# Patient Record
Sex: Male | Born: 1970 | Hispanic: No | Marital: Married | Smoking: Current some day smoker
Health system: Southern US, Community
[De-identification: ages and names within clinical notes are randomized; demographics above are authoritative.]

## PROBLEM LIST (undated history)

## (undated) DIAGNOSIS — L309 Dermatitis, unspecified: Secondary | ICD-10-CM

## (undated) DIAGNOSIS — I10 Essential (primary) hypertension: Secondary | ICD-10-CM

## (undated) HISTORY — DX: Dermatitis, unspecified: L30.9

---

## 2015-02-07 ENCOUNTER — Emergency Department (HOSPITAL_COMMUNITY)
Admission: EM | Admit: 2015-02-07 | Discharge: 2015-02-07 | Disposition: A | Discharge status: Home / Self Care | Payer: Self-pay | Attending: Emergency Medicine | Admitting: Emergency Medicine

## 2015-02-07 ENCOUNTER — Encounter (HOSPITAL_COMMUNITY): Payer: Self-pay | Admitting: Emergency Medicine

## 2015-02-07 DIAGNOSIS — H8110 Benign paroxysmal vertigo, unspecified ear: Secondary | ICD-10-CM | POA: Insufficient documentation

## 2015-02-07 DIAGNOSIS — Z720 Tobacco use: Secondary | ICD-10-CM | POA: Insufficient documentation

## 2015-02-07 DIAGNOSIS — R42 Dizziness and giddiness: Secondary | ICD-10-CM

## 2015-02-07 DIAGNOSIS — N289 Disorder of kidney and ureter, unspecified: Secondary | ICD-10-CM | POA: Insufficient documentation

## 2015-02-07 HISTORY — DX: Essential (primary) hypertension: I10

## 2015-02-07 LAB — COMPREHENSIVE METABOLIC PANEL
ALT: 28 U/L (ref 17–63)
ANION GAP: 9 (ref 5–15)
AST: 28 U/L (ref 15–41)
Albumin: 3.6 g/dL (ref 3.5–5.0)
Alkaline Phosphatase: 76 U/L (ref 38–126)
BILIRUBIN TOTAL: 0.2 mg/dL — AB (ref 0.3–1.2)
BUN: 9 mg/dL (ref 6–20)
CALCIUM: 9.2 mg/dL (ref 8.9–10.3)
CHLORIDE: 107 mmol/L (ref 101–111)
CO2: 24 mmol/L (ref 22–32)
CREATININE: 1.37 mg/dL — AB (ref 0.61–1.24)
GLUCOSE: 171 mg/dL — AB (ref 65–99)
POTASSIUM: 3.4 mmol/L — AB (ref 3.5–5.1)
Sodium: 140 mmol/L (ref 135–145)
Total Protein: 6 g/dL — ABNORMAL LOW (ref 6.5–8.1)

## 2015-02-07 LAB — CBC WITH DIFFERENTIAL/PLATELET
BASOS ABS: 0 10*3/uL (ref 0.0–0.1)
Basophils Relative: 0 %
EOS PCT: 2 %
Eosinophils Absolute: 0.2 10*3/uL (ref 0.0–0.7)
HCT: 47.4 % (ref 39.0–52.0)
Hemoglobin: 16.7 g/dL (ref 13.0–17.0)
LYMPHS PCT: 26 %
Lymphs Abs: 2.3 10*3/uL (ref 0.7–4.0)
MCH: 32.3 pg (ref 26.0–34.0)
MCHC: 35.2 g/dL (ref 30.0–36.0)
MCV: 91.7 fL (ref 78.0–100.0)
MONO ABS: 0.9 10*3/uL (ref 0.1–1.0)
MONOS PCT: 10 %
Neutro Abs: 5.6 10*3/uL (ref 1.7–7.7)
Neutrophils Relative %: 62 %
Platelets: 284 10*3/uL (ref 150–400)
RBC: 5.17 MIL/uL (ref 4.22–5.81)
RDW: 13.8 % (ref 11.5–15.5)
WBC: 9.1 10*3/uL (ref 4.0–10.5)

## 2015-02-07 MED ORDER — MECLIZINE HCL 25 MG PO TABS: 25.0000 mg | ORAL_TABLET | Freq: Three times a day (TID) | ORAL | Status: DC | PRN

## 2015-02-07 NOTE — ED Provider Notes (Signed)
CSN: [id number]     Arrival date & time 02/07/15  4098 History   First MD Initiated Contact with Patient 02/07/15 [phone]     Chief complaint: Dizziness  (Consider location/radiation/quality/duration/timing/severity/associated sxs/prior Treatment) The history is provided by the patient. A language interpreter was used.   44 year old male is a [place] refugee and has been here for 3 weeks. This evening, he had sudden onset of dizziness which was like a spinning sensation. He was unable to stand but did not have any nausea or vomiting. He states that his face felt hot. He denies headache or tinnitus or hearing loss. He had a similar episode about 3 months ago which lasted about 10 or 15 minutes. This episode also resolved within about 10 minutes and he feels like he is back to normal. He is a cigarette smoker admitting the smoking 2 packs of cigarettes a day but denies ethanol use and denies significant medical history.  Past Medical History  Diagnosis Date  . Hypertension    History reviewed. No pertinent past surgical history. No family history on file. Social History  Substance Use Topics  . Smoking status: Current Some Day Smoker  . Smokeless tobacco: None  . Alcohol Use: No    Review of Systems  All other systems reviewed and are negative.     Allergies  Review of patient's allergies indicates no known allergies.  Home Medications   Prior to Admission medications   Not on File   BP 132/79 mmHg  Temp(Src) 98.5 F (36.9 C) (Oral)  Resp 16  SpO2 95% Physical Exam  Nursing note and vitals reviewed.  44 year old male, resting comfortably and in no acute distress. Vital signs are normal. Oxygen saturation is 95%, which is normal. Head is normocephalic and atraumatic. PERRLA, EOMI. Oropharynx is clear. Neck is nontender and supple without adenopathy or JVD. There are no carotid bruits. Back is nontender and there is no CVA tenderness. Lungs are clear without rales, wheezes, or  rhonchi. Chest is nontender. Heart has regular rate and rhythm without murmur. Abdomen is soft, flat, nontender without masses or hepatosplenomegaly and peristalsis is normoactive. Extremities have no cyanosis or edema, full range of motion is present. Skin is warm and dry without rash. Neurologic: Mental status is normal, cranial nerves are intact, there are no motor or sensory deficits. Dizziness is not reproduced by passive head movement. Romberg is normal.  ED Course  Procedures (including critical care time) Labs Review Results for orders placed or performed during the hospital encounter of 02/07/15  CBC with Differential  Result Value Ref Range   WBC 9.1 4.0 - 10.5 K/uL   RBC 5.17 4.22 - 5.81 MIL/uL   Hemoglobin 16.7 13.0 - 17.0 g/dL   HCT 47.8 29.5 - 62.1 %   MCV 91.7 78.0 - 100.0 fL   MCH 32.3 26.0 - 34.0 pg   MCHC 35.2 30.0 - 36.0 g/dL   RDW 30.8 65.7 - 84.6 %   Platelets 284 150 - 400 K/uL   Neutrophils Relative % 62 %   Neutro Abs 5.6 1.7 - 7.7 K/uL   Lymphocytes Relative 26 %   Lymphs Abs 2.3 0.7 - 4.0 K/uL   Monocytes Relative 10 %   Monocytes Absolute 0.9 0.1 - 1.0 K/uL   Eosinophils Relative 2 %   Eosinophils Absolute 0.2 0.0 - 0.7 K/uL   Basophils Relative 0 %   Basophils Absolute 0.0 0.0 - 0.1 K/uL  Comprehensive metabolic panel  Result Value  Ref Range   Sodium 140 135 - 145 mmol/L   Potassium 3.4 (L) 3.5 - 5.1 mmol/L   Chloride 107 101 - 111 mmol/L   CO2 24 22 - 32 mmol/L   Glucose, Bld 171 (H) 65 - 99 mg/dL   BUN 9 6 - 20 mg/dL   Creatinine, Ser 8.11 (H) 0.61 - 1.24 mg/dL   Calcium 9.2 8.9 - 91.4 mg/dL   Total Protein 6.0 (L) 6.5 - 8.1 g/dL   Albumin 3.6 3.5 - 5.0 g/dL   AST 28 15 - 41 U/L   ALT 28 17 - 63 U/L   Alkaline Phosphatase 76 38 - 126 U/L   Total Bilirubin 0.2 (L) 0.3 - 1.2 mg/dL   GFR calc non Af Amer >60 >60 mL/min   GFR calc Af Amer >60 >60 mL/min   Anion gap 9 5 - 15   I have personally reviewed and evaluated these lab results as  part of my medical decision-making.  ECG shows normal sinus rhythm with a rate of 92, no ectopy. Normal axis. Normal P wave. Normal QRS. Normal intervals. Normal ST and T waves. Probable left atrial hypertrophy. Impression: Left atrial hypertrophy, otherwise normal ECG. No prior ECG available for comparison.   MDM   Final diagnoses:  Paroxysmal vertigo  Renal insufficiency    Peripheral vertigo which has spontaneously resolved. He will be given a prescription for meclizine to use on an as-needed basis.    Dione Booze, MD 02/07/15 [phone]

## 2015-02-07 NOTE — ED Notes (Signed)
Dr. Preston Fleeting explained plan of care and discharge plan to pt.

## 2015-02-07 NOTE — Discharge Instructions (Signed)
Vertigo °Vertigo means you feel like you or your surroundings are moving when they are not. Vertigo can be dangerous if it occurs when you are at work, driving, or performing difficult activities.  °CAUSES  °Vertigo occurs when there is a conflict of signals sent to your brain from the visual and sensory systems in your body. There are many different causes of vertigo, including: °· Infections, especially in the inner ear. °· A bad reaction to a drug or misuse of alcohol and medicines. °· Withdrawal from drugs or alcohol. °· Rapidly changing positions, such as lying down or rolling over in bed. °· A migraine headache. °· Decreased blood flow to the brain. °· Increased pressure in the brain from a head injury, infection, tumor, or bleeding. °SYMPTOMS  °You may feel as though the world is spinning around or you are falling to the ground. Because your balance is upset, vertigo can cause nausea and vomiting. You may have involuntary eye movements (nystagmus). °DIAGNOSIS  °Vertigo is usually diagnosed by physical exam. If the cause of your vertigo is unknown, your caregiver may perform imaging tests, such as an MRI scan (magnetic resonance imaging). °TREATMENT  °Most cases of vertigo resolve on their own, without treatment. Depending on the cause, your caregiver may prescribe certain medicines. If your vertigo is related to body position issues, your caregiver may recommend movements or procedures to correct the problem. In rare cases, if your vertigo is caused by certain inner ear problems, you may need surgery. °HOME CARE INSTRUCTIONS  °· Follow your caregiver's instructions. °· Avoid driving. °· Avoid operating heavy machinery. °· Avoid performing any tasks that would be dangerous to you or others during a vertigo episode. °· Tell your caregiver if you notice that certain medicines seem to be causing your vertigo. Some of the medicines used to treat vertigo episodes can actually make them worse in some people. °SEEK  IMMEDIATE MEDICAL CARE IF:  °· Your medicines do not relieve your vertigo or are making it worse. °· You develop problems with talking, walking, weakness, or using your arms, hands, or legs. °· You develop severe headaches. °· Your nausea or vomiting continues or gets worse. °· You develop visual changes. °· A family member notices behavioral changes. °· Your condition gets worse. °MAKE SURE YOU: °· Understand these instructions. °· Will watch your condition. °· Will get help right away if you are not doing well or get worse. °Document Released: 02/09/2005 Document Revised: 07/25/2011 Document Reviewed: 11/18/2010 °ExitCare® Patient Information ©2015 [employer]. This information is not intended to replace advice given to you by your health care provider. Make sure you discuss any questions you have with your health care provider. ° °Meclizine tablets or capsules °What is this medicine? °MECLIZINE (MEK li zeen) is an antihistamine. It is used to prevent nausea, vomiting, or dizziness caused by motion sickness. It is also used to prevent and treat vertigo (extreme dizziness or a feeling that you or your surroundings are tilting or spinning around). °This medicine may be used for other purposes; ask your health care provider or pharmacist if you have questions. °COMMON BRAND NAME(S): Antivert, Dramamine Less Drowsy, Medivert, Meni-D °What should I tell my health care provider before I take this medicine? °They need to know if you have any of these conditions: °-asthma °-glaucoma °-prostate trouble °-stomach problems °-urinary problems °-an unusual or allergic reaction to meclizine, other medicines, foods, dyes, or preservatives °-pregnant or trying to get pregnant °-breast-feeding °How should I use this medicine? °  Take this medicine by mouth with a glass of water. Follow the directions on the prescription label. If you are using this medicine to prevent motion sickness, take the dose at least 1 hour before travel.  If it upsets your stomach, take it with food or milk. Take your doses at regular intervals. Do not take your medicine more often than directed. Talk to your pediatrician regarding the use of this medicine in children. Special care may be needed. Overdosage: If you think you have taken too much of this medicine contact a poison control center or emergency room at once. NOTE: This medicine is only for you. Do not share this medicine with others. What if I miss a dose? If you miss a dose, take it as soon as you can. If it is almost time for your next dose, take only that dose. Do not take double or extra doses. What may interact with this medicine? -barbiturate medicines for inducing sleep or treating seizures -digoxin -medicines for anxiety or sleeping problems, like alprazolam, diazepam or temazepam -medicines for hay fever and other allergies -medicines for mental depression -medicines for movement abnormalities as in Parkinson's disease, or for stomach problems -medicines for pain -medicines that relax muscles This list may not describe all possible interactions. Give your health care provider a list of all the medicines, herbs, non-prescription drugs, or dietary supplements you use. Also tell them if you smoke, drink alcohol, or use illegal drugs. Some items may interact with your medicine. What should I watch for while using this medicine? If you are taking this medicine on a regular schedule, visit your doctor or health care professional for regular checks on your progress. You may get dizzy, drowsy or have blurred vision. Do not drive, use machinery, or do anything that needs mental alertness until you know how this medicine affects you. Do not stand or sit up quickly, especially if you are an older patient. This reduces the risk of dizzy or fainting spells. Alcohol can increase possible dizziness. Avoid alcoholic drinks. Your mouth may get dry. Chewing sugarless gum or sucking hard candy,  and drinking plenty of water may help. Contact your doctor if the problem does not go away or is severe. This medicine may cause dry eyes and blurred vision. If you wear contact lenses you may feel some discomfort. Lubricating drops may help. See your eye doctor if the problem does not go away or is severe. What side effects may I notice from receiving this medicine? Side effects that you should report to your doctor or health care professional as soon as possible: -fainting spells -fast or irregular heartbeat Side effects that usually do not require medical attention (report to your doctor or health care professional if they continue or are bothersome): -constipation -difficulty passing urine -difficulty sleeping -headache -stomach upset This list may not describe all possible side effects. Call your doctor for medical advice about side effects. You may report side effects to FDA at 1-800-FDA-1088. Where should I keep my medicine? Keep out of the reach of children. Store at room temperature between 15 and 30 degrees C (59 and 86 degrees F). Keep container tightly closed. Throw away any unused medicine after the expiration date. NOTE: This sheet is a summary. It may not cover all possible information. If you have questions about this medicine, talk to your doctor, pharmacist, or health care provider.  2015, Elsevier/Gold Standard. (2007-11-08 10:35:36)   Emergency Department Resource Guide 1) Find a Doctor and Pay Out of  Pocket Although you won't have to find out who is covered by your insurance plan, it is a good idea to ask around and get recommendations. You will then need to call the office and see if the doctor you have chosen will accept you as a new patient and what types of options they offer for patients who are self-pay. Some doctors offer discounts or will set up payment plans for their patients who do not have insurance, but you will need to ask so you aren't surprised when you get  to your appointment.  2) Contact Your Local Health Department Not all health departments have doctors that can see patients for sick visits, but many do, so it is worth a call to see if yours does. If you don't know where your local health department is, you can check in your phone book. The CDC also has a tool to help you locate your state's health department, and many state websites also have listings of all of their local health departments.  3) Find a Walk-in Clinic If your illness is not likely to be very severe or complicated, you may want to try a walk in clinic. These are popping up all over the country in pharmacies, drugstores, and shopping centers. They're usually staffed by nurse practitioners or physician assistants that have been trained to treat common illnesses and complaints. They're usually fairly quick and inexpensive. However, if you have serious medical issues or chronic medical problems, these are probably not your best option.  No Primary Care Doctor: - Call Health Connect at  [phone] - they can help you locate a primary care doctor that  accepts your insurance, provides certain services, etc. - Physician Referral Service- [phone]  Chronic Pain Problems: Organization         Address  Phone   Notes  [facility]  [phone] Patients need to be referred by their primary care doctor.   Medication Assistance: Organization         Address  Phone   Notes  [facility] Medication [facility] [address] [place] [place], [place] [address] [phone] --Must be a resident of [facility] -- Must have NO insurance coverage whatsoever (no Medicaid/ Medicare, etc.) -- The pt. MUST have a primary care doctor that directs their care regularly and follows them in the community   [facility]  [phone]   [employer]  [phone]    Agencies that provide inexpensive medical care: Organization         Address  Phone    Notes  [facility]  [phone]   [facility]    [phone]   [facility] [address] [place], [place] [address] [phone]   [facility] [address] [place] [phone]   Planned Parenthood    [phone]   [facility]    [phone]   [facility]  [address], [place] Phone:  [phone], Fax:  [phone] Hours of Operation:  9 am - 6 pm, M-F.  Also accepts Medicaid/Medicare and self-pay.  [facility]  [address], [place] Phone: [phone], Fax: [phone]. Hours of Operation:  8:30 am - 5:30 pm, M-F.  Also accepts Medicaid and self-pay.  [facility] [place] [address], [employer] Phone: [phone]   [facility] [address] [place], [place] [phone], Ext. [phone] Mondays & Thursdays: 7-9 AM.  First 15 patients are seen on a first come, first serve basis.    Medicaid-accepting [facility] Providers:  Organization         Address  Phone   Notes  [facility] [address], [place] [phone] Also accepts self-pay patients.  [facility] [address] Laurell Josephs [place], [place]  [phone]   [facility] [address], [place] [phone]   [facility] [address], [place] [phone]   Renaye Rakers [address], [place]   [phone] Only accepts [place] Access [place] patients after they have their name applied to their card.   Self-Pay (no insurance) in [facility]:  Organization         Address  Phone   Notes  Sickle Cell Patients, [facility] [address] [place], [place] [phone]   [facility] [address] [place], [place] [phone]   [facility]  [address], [place] [phone]   [facility]/Dr. Osei-Bonsu  [address], [place] or [address], [place] [phone] Phone number for both [place] and [place] locations is the same.  [facility] and [facility] [address] [place] [phone]   [facility] [address] [place] or [address] [phone]   [facility] [address] [place] [phone], phone; [phone], fax Sees patients 1st and 3rd Saturday of every month.  Must not qualify for public or private insurance (i.e. Medicaid, Medicare, [place] Health Choice, Veterans' Benefits)  Household income should be no more than 200% of the poverty level The clinic cannot treat you if you are pregnant or think you are pregnant  Sexually transmitted diseases are not treated at the clinic.    Dental Care: Organization         Address  Phone  Notes  [facility] Department of [facility] [address] [place], [place] [phone] Accepts children up to age 76 who are enrolled in [place] or [place] Health Choice; pregnant women with a Medicaid card; and children who have applied for Medicaid or [place] Health Choice, but were declined, whose parents can pay a reduced fee at time of service.  [facility] Department of [facility]  [address], [place], [place] [phone] Accepts children up to age 101 who are enrolled in [place] or [place] Health Choice; pregnant women with a Medicaid card; and children who have applied for Medicaid or [place] Health Choice, but were declined, whose parents can pay a reduced fee at time of service.  [facility]  [address] [place], [place] [phone] Patients are seen by appointment only. Walk-ins are not accepted. [facility] will see patients 5 years of age  and older. Monday - Tuesday (8am-5pm) Most Wednesdays (8:30-5pm) $30 per visit, cash only  [facility]  [address], [facility] [phone] Patients are seen by appointment only. Walk-ins are not accepted. [facility] will see patients 39 years of age and older. One Wednesday Evening (Monthly: Volunteer Based).  $30 per visit, cash only  [employer] of Dentistry Clinics  [phone] for adults; Children under age 66, call Graduate Pediatric Dentistry at [phone]. Children aged 71-14, please call [phone] to request a pediatric application.  Dental services are provided in all areas of dental care including fillings, crowns and bridges, complete and partial dentures, implants, gum treatment, root canals, and extractions. Preventive care is also provided. Treatment is provided to both adults and children. Patients are selected via a lottery and there is often a waiting list.   [facility] [address] [place]  [phone] [email or web address]   [facility] [address] [place], [place] [phone], Ext. [phone] Second and Fourth Thursday of each month, opens at 6:30 AM; Clinic ends at 9 AM.  Patients are seen on a first-come first-served basis, and a limited number are seen during each clinic.   [facility]  [address] Ether Griffins [place], [place] [phone]   Eligibility Requirements You must have lived in [place], [place], or [place] counties for at least the last three months.   You cannot be eligible for state or federal sponsored [place], including [employer], [place], or [employer].   You generally cannot be eligible for healthcare insurance through your employer.    How to apply: Eligibility screenings are held every Tuesday and Wednesday afternoon from 1:00 pm until 4:00 pm. You do not need an appointment for the interview!  [facility] [address], [place], [place] [phone]   [facility]  [phone]   [facility] Health Department  [phone]   [facility] Department  [phone]    Behavioral Health Resources in the Community: Intensive Outpatient Programs Organization         Address  Phone  Notes  [facility] [address] [place], [place] [phone]   [facility] [address], [place], [place] [phone]   [facility] [address] [place], [place]  [phone]   [facility] [address],  [place], [place] [phone] or [phone]   Substance Abuse Resources Organization         Address  Phone  Notes  [facility]  [phone]   [facility]  [phone]   [facility] [place]  [phone]   Floydene Flock  [phone]   Residential & Outpatient Substance Abuse Program  [phone]   Psychological Services Organization         Address  Phone  Notes  [facility] Behavioral Health  [phone]   [facility] Services  [phone]   [facility] Mental Health [address] [place] [phone] or [phone]    Mobile Crisis Teams Organization         Address  Phone  Notes  [facility], Mobile Crisis Care Unit  [phone]   [facility]  [address] [place], [place] [phone]   Doristine Locks [address] [place] [phone]    Self-Help/Support Groups Organization         Address  Phone             Notes  Mental Health Assoc. of  - variety of support groups  336- I7437963 Call for more information  Narcotics Anonymous (NA), [facility] [address], [employer] [place]  2 meetings at this location   Statistician  Address  Phone  Notes  [facility] [address]    [place]  [phone]   [facility]  [address] [place] [address], [place], [place] [phone]   [facility] [address] [place], [place] [phone] Admissions: 8am-3pm M-F  [facility] [address]    [place], [place] [phone]   [facility] [address] [place], [place], [place] [phone]   [facility] [address]  [place], [place] [phone]   [facility] - Intensive Outpatient [address] Laurell Josephs [address], [place], [place] [phone]   [facility] ([facility] [address] [place]  [place], [place] [phone] or [phone]   [facility] ([facility]) [address], [place], [place] [phone] Accepts Medicaid  [facility] [address]  [place] [phone] Substance Abuse/Addiction Treatment   [facility] Organization         Address  Phone  Notes  [facility]  [phone]   Angie Fava, PhD [address] Ervin Knack [place], [place]   [phone] or [phone]   [facility]   [address] [place], [place] [phone]   [facility] [address] [place], [place] [phone] Insurance/Medicaid/sponsorship through [facility] and Families [address]                                    [place], [place] [phone] Therapy/tele-psych/case  [facility] [address], [place] [phone]    Dr. Lolly Mustache  [phone]   [facility]  [employer] [facility] Dept. 1) [address] [place] 2) [address], [place] 3)  [address], [place] [phone]  [phone]   [facility] Child Abuse Hotline [phone]) 561-9619 or [phone] (After Hours)

## 2015-02-07 NOTE — ED Notes (Addendum)
Pt. arrived with EMS from home reports dizziness onset this evening , denies nausea or vomitting . No fever or chills. Pt. speaks Arabic , a refugee from [place] arrived here 01/16/2015 . Interpreter service utilized during encounter.

## 2015-02-07 NOTE — ED Notes (Signed)
EKG reviewed by Dr. Preston Fleeting.

## 2015-04-07 ENCOUNTER — Emergency Department (INDEPENDENT_AMBULATORY_CARE_PROVIDER_SITE_OTHER)
Admission: EM | Admit: 2015-04-07 | Discharge: 2015-04-07 | Disposition: A | Discharge status: Home / Self Care | Payer: Medicaid Other | Source: Home / Self Care

## 2015-04-07 ENCOUNTER — Encounter (HOSPITAL_COMMUNITY): Payer: Self-pay | Admitting: Emergency Medicine

## 2015-04-07 DIAGNOSIS — J40 Bronchitis, not specified as acute or chronic: Secondary | ICD-10-CM

## 2015-04-07 LAB — POCT RAPID STREP A: Streptococcus, Group A Screen (Direct): NEGATIVE

## 2015-04-07 MED ORDER — AZITHROMYCIN 250 MG PO TABS: 250.0000 mg | ORAL_TABLET | Freq: Every day | ORAL | Status: DC

## 2015-04-07 MED ORDER — IBUPROFEN 600 MG PO TABS
600.0000 mg | ORAL_TABLET | Freq: Four times a day (QID) | ORAL | Status: DC | PRN
Start: 2015-04-07 — End: 2015-06-07

## 2015-04-07 MED ORDER — ALBUTEROL SULFATE HFA 108 (90 BASE) MCG/ACT IN AERS: 2.0000 | INHALATION_SPRAY | RESPIRATORY_TRACT | Status: DC | PRN

## 2015-04-07 NOTE — Discharge Instructions (Signed)
Upper Respiratory Infection, Adult Most upper respiratory infections (URIs) are a viral infection of the air passages leading to the lungs. A URI affects the nose, throat, and upper air passages. The most common type of URI is nasopharyngitis and is typically referred to as "the common cold." URIs run their course and usually go away on their own. Most of the time, a URI does not require medical attention, but sometimes a bacterial infection in the upper airways can follow a viral infection. This is called a secondary infection. Sinus and middle ear infections are common types of secondary upper respiratory infections. Bacterial pneumonia can also complicate a URI. A URI can worsen asthma and chronic obstructive pulmonary disease (COPD). Sometimes, these complications can require emergency medical care and may be life threatening.  CAUSES Almost all URIs are caused by viruses. A virus is a type of germ and can spread from one person to another.  RISKS FACTORS You may be at risk for a URI if:   You smoke.   You have chronic heart or lung disease.  You have a weakened defense (immune) system.   You are very young or very old.   You have nasal allergies or asthma.  You work in crowded or poorly ventilated areas.  You work in health care facilities or schools. SIGNS AND SYMPTOMS  Symptoms typically develop 2-3 days after you come in contact with a cold virus. Most viral URIs last 7-10 days. However, viral URIs from the influenza virus (flu virus) can last 14-18 days and are typically more severe. Symptoms may include:   Runny or stuffy (congested) nose.   Sneezing.   Cough.   Sore throat.   Headache.   Fatigue.   Fever.   Loss of appetite.   Pain in your forehead, behind your eyes, and over your cheekbones (sinus pain).  Muscle aches.  DIAGNOSIS  Your health care provider may diagnose a URI by:  Physical exam.  Tests to check that your symptoms are not due to  another condition such as:  Strep throat.  Sinusitis.  Pneumonia.  Asthma. TREATMENT  A URI goes away on its own with time. It cannot be cured with medicines, but medicines may be prescribed or recommended to relieve symptoms. Medicines may help:  Reduce your fever.  Reduce your cough.  Relieve nasal congestion. HOME CARE INSTRUCTIONS   Take medicines only as directed by your health care provider.   Gargle warm saltwater or take cough drops to comfort your throat as directed by your health care provider.  Use a warm mist humidifier or inhale steam from a shower to increase air moisture. This may make it easier to breathe.  Drink enough fluid to keep your urine clear or pale yellow.   Eat soups and other clear broths and maintain good nutrition.   Rest as needed.   Return to work when your temperature has returned to normal or as your health care provider advises. You may need to stay home longer to avoid infecting others. You can also use a face mask and careful hand washing to prevent spread of the virus.  Increase the usage of your inhaler if you have asthma.   Do not use any tobacco products, including cigarettes, chewing tobacco, or electronic cigarettes. If you need help quitting, ask your health care provider. PREVENTION  The best way to protect yourself from getting a cold is to practice good hygiene.   Avoid oral or hand contact with people with cold   symptoms.   Wash your hands often if contact occurs.  There is no clear evidence that vitamin C, vitamin E, echinacea, or exercise reduces the chance of developing a cold. However, it is always recommended to get plenty of rest, exercise, and practice good nutrition.  SEEK MEDICAL CARE IF:   You are getting worse rather than better.   Your symptoms are not controlled by medicine.   You have chills.  You have worsening shortness of breath.  You have brown or red mucus.  You have yellow or brown nasal  discharge.  You have pain in your face, especially when you bend forward.  You have a fever.  You have swollen neck glands.  You have pain while swallowing.  You have white areas in the back of your throat. SEEK IMMEDIATE MEDICAL CARE IF:   You have severe or persistent:  Headache.  Ear pain.  Sinus pain.  Chest pain.  You have chronic lung disease and any of the following:  Wheezing.  Prolonged cough.  Coughing up blood.  A change in your usual mucus.  You have a stiff neck.  You have changes in your:  Vision.  Hearing.  Thinking.  Mood. MAKE SURE YOU:   Understand these instructions.  Will watch your condition.  Will get help right away if you are not doing well or get worse.   This information is not intended to replace advice given to you by your health care provider. Make sure you discuss any questions you have with your health care provider.   Document Released: 10/26/2000 Document Revised: 09/16/2014 Document Reviewed: 08/07/2013 Elsevier Interactive Patient Education 2016 Elsevier Inc.  

## 2015-04-07 NOTE — ED Notes (Signed)
Pt here with c/o cough, congestion with yellow, thick phlegm Fever, chills and body aches noted as well No medication taken at home, sx's started 2 dys ago [place] interpretor used for Arabic communication  Afebrile, smoker

## 2015-04-11 LAB — CULTURE, GROUP A STREP

## 2015-06-07 ENCOUNTER — Emergency Department (HOSPITAL_COMMUNITY)
Admission: EM | Admit: 2015-06-07 | Discharge: 2015-06-07 | Disposition: A | Discharge status: Home / Self Care | Payer: Medicaid Other | Attending: Emergency Medicine | Admitting: Emergency Medicine

## 2015-06-07 ENCOUNTER — Encounter (HOSPITAL_COMMUNITY): Payer: Self-pay | Admitting: Adult Health

## 2015-06-07 DIAGNOSIS — J029 Acute pharyngitis, unspecified: Secondary | ICD-10-CM | POA: Insufficient documentation

## 2015-06-07 DIAGNOSIS — I10 Essential (primary) hypertension: Secondary | ICD-10-CM | POA: Diagnosis not present

## 2015-06-07 DIAGNOSIS — F17200 Nicotine dependence, unspecified, uncomplicated: Secondary | ICD-10-CM | POA: Insufficient documentation

## 2015-06-07 DIAGNOSIS — Z792 Long term (current) use of antibiotics: Secondary | ICD-10-CM | POA: Diagnosis not present

## 2015-06-07 DIAGNOSIS — Z79899 Other long term (current) drug therapy: Secondary | ICD-10-CM | POA: Insufficient documentation

## 2015-06-07 LAB — RAPID STREP SCREEN (MED CTR MEBANE ONLY): STREPTOCOCCUS, GROUP A SCREEN (DIRECT): NEGATIVE

## 2015-06-07 MED ORDER — IBUPROFEN 600 MG PO TABS: 600.0000 mg | ORAL_TABLET | Freq: Four times a day (QID) | ORAL | Status: DC | PRN

## 2015-06-07 NOTE — ED Provider Notes (Signed)
CSN: [id number]     Arrival date & time 06/07/15  1905 History  By signing my name below, I, Murriel Hopper, attest that this documentation has been prepared under the direction and in the presence of [employer], PA-C.  Electronically Signed: Murriel Hopper, ED Scribe. 06/07/2015. 8:07 PM.   Chief Complaint  Patient presents with  . Sore Throat      Patient is a 45 y.o. male presenting with pharyngitis. The history is provided by the patient and a relative. A language interpreter was used.  Sore Throat Pertinent negatives include no chest pain and no shortness of breath.   HPI Comments: Craig Gillespie is a 45 y.o. male who presents to the Emergency Department complaining of a constant, worsening sore throat that has been present for three days. Pt also reports that he has developed a productive cough, has had intermittent chills, and has pain while swallowing. Pt denies SOB, chest pain, fever, or difficulty swallowing. He has not taken anything for his symptoms.   Past Medical History  Diagnosis Date  . Hypertension    History reviewed. No pertinent past surgical history. History reviewed. No pertinent family history. Social History  Substance Use Topics  . Smoking status: Current Some Day Smoker  . Smokeless tobacco: None  . Alcohol Use: No    Review of Systems  Constitutional: Positive for chills. Negative for fever.  HENT: Positive for sore throat. Negative for trouble swallowing.   Respiratory: Positive for cough. Negative for shortness of breath.   Cardiovascular: Negative for chest pain.  All other systems reviewed and are negative.     Allergies  Review of patient's allergies indicates no known allergies.  Home Medications   Prior to Admission medications   Medication Sig Start Date End Date Taking? Authorizing Provider  albuterol (PROVENTIL HFA;VENTOLIN HFA) 108 (90 BASE) MCG/ACT inhaler Inhale 2 puffs into the lungs every 4 (four) hours as needed for  wheezing or shortness of breath. 04/07/15   Tharon Aquas, PA  azithromycin (ZITHROMAX) 250 MG tablet Take 1 tablet (250 mg total) by mouth daily. Take first 2 tablets together, then 1 every day until finished. 04/07/15   Tharon Aquas, PA  ibuprofen (ADVIL,MOTRIN) 600 MG tablet Take 1 tablet (600 mg total) by mouth every 6 (six) hours as needed. 04/07/15   Tharon Aquas, PA  meclizine (ANTIVERT) 25 MG tablet Take 1 tablet (25 mg total) by mouth 3 (three) times daily as needed for dizziness. 02/07/15   Dione Booze, MD   BP 128/74 mmHg  Pulse 86  Temp(Src) 98.5 F (36.9 C) (Oral)  Resp 18  Wt 198 lb 6.6 oz (90 kg)  SpO2 98% Physical Exam  Constitutional: He is oriented to person, place, and time. He appears well-developed and well-nourished.  HENT:  Head: Normocephalic and atraumatic.  Mouth/Throat: Uvula is midline. No trismus in the jaw. Posterior oropharyngeal erythema present. No oropharyngeal exudate or posterior oropharyngeal edema.  Erythema of oropharynx No exudates Uvula is midline Handling secretions well Normal voice phonation Positive anterior cervical lymphadenopathy  Normal TMs bilaterally   Neck: Normal range of motion. Neck supple.  Cardiovascular: Normal rate, regular rhythm and normal heart sounds.   Pulmonary/Chest: Effort normal and breath sounds normal. No respiratory distress. He has no wheezes. He has no rales. He exhibits no tenderness.  Abdominal: He exhibits no distension.  Lymphadenopathy:    He has cervical adenopathy.  Neurological: He is alert and oriented to person, place, and time.  Skin: Skin is warm and dry.  Psychiatric: He has a normal mood and affect.  Nursing note and vitals reviewed.   ED Course  Procedures (including critical care time)  DIAGNOSTIC STUDIES: Oxygen Saturation is 98% on room air, normal by my interpretation.    COORDINATION OF CARE: 8:00 PM Discussed treatment plan with pt at bedside and pt agreed to plan.   Labs  Review Labs Reviewed  RAPID STREP SCREEN (NOT AT [facility])  CULTURE, GROUP A STREP [facility])    Imaging Review No results found. I have personally reviewed and evaluated these images and lab results as part of my medical decision-making.   EKG Interpretation None      MDM   Final diagnoses:  None  Patient presents today with a chief complaint of sore throat.  Rapid strep negative.  No signs of peritonsillar abscess or retropharyngeal abscess.  Suspect viral illness.  Patient stable for discharge.  Return precautions given. I personally performed the services described in this documentation, which was scribed in my presence. The recorded information has been reviewed and is accurate.     Santiago Glad, PA-C 06/07/15 2344  Gerhard Munch, MD 06/07/15 [phone]

## 2015-06-07 NOTE — ED Notes (Signed)
Presents with red , sore throat associated with fever of unknown temperature last night. Pt als reports cough and pain with swallowing. Throat red.

## 2015-06-10 LAB — CULTURE, GROUP A STREP (THRC)

## 2015-06-13 ENCOUNTER — Encounter (HOSPITAL_COMMUNITY): Payer: Self-pay | Admitting: Emergency Medicine

## 2015-06-13 ENCOUNTER — Emergency Department (HOSPITAL_COMMUNITY)
Admission: EM | Admit: 2015-06-13 | Discharge: 2015-06-13 | Disposition: A | Discharge status: Home / Self Care | Payer: Medicaid Other | Attending: Emergency Medicine | Admitting: Emergency Medicine

## 2015-06-13 ENCOUNTER — Emergency Department (HOSPITAL_COMMUNITY): Payer: Medicaid Other

## 2015-06-13 DIAGNOSIS — Z792 Long term (current) use of antibiotics: Secondary | ICD-10-CM | POA: Diagnosis not present

## 2015-06-13 DIAGNOSIS — Z79899 Other long term (current) drug therapy: Secondary | ICD-10-CM | POA: Diagnosis not present

## 2015-06-13 DIAGNOSIS — J40 Bronchitis, not specified as acute or chronic: Secondary | ICD-10-CM | POA: Insufficient documentation

## 2015-06-13 DIAGNOSIS — F17200 Nicotine dependence, unspecified, uncomplicated: Secondary | ICD-10-CM | POA: Diagnosis not present

## 2015-06-13 DIAGNOSIS — I10 Essential (primary) hypertension: Secondary | ICD-10-CM | POA: Diagnosis not present

## 2015-06-13 DIAGNOSIS — R05 Cough: Secondary | ICD-10-CM | POA: Diagnosis present

## 2015-06-13 LAB — RAPID STREP SCREEN (MED CTR MEBANE ONLY): Streptococcus, Group A Screen (Direct): NEGATIVE

## 2015-06-13 MED ORDER — GUAIFENESIN-DM 100-10 MG/5ML PO SYRP: 5.0000 mL | ORAL_SOLUTION | ORAL | Status: DC | PRN

## 2015-06-13 MED ORDER — PREDNISONE 20 MG PO TABS
60.0000 mg | ORAL_TABLET | Freq: Once | ORAL | Status: AC
  Administered 2015-06-13: 60 mg via ORAL
  Filled 2015-06-13: qty 3

## 2015-06-13 MED ORDER — PREDNISONE 10 MG PO TABS: 20.0000 mg | ORAL_TABLET | Freq: Every day | ORAL | Status: DC

## 2015-06-13 MED ORDER — ALBUTEROL SULFATE HFA 108 (90 BASE) MCG/ACT IN AERS
2.0000 | INHALATION_SPRAY | Freq: Once | RESPIRATORY_TRACT | Status: AC
  Administered 2015-06-13: 2 via RESPIRATORY_TRACT
  Filled 2015-06-13: qty 6.7

## 2015-06-13 NOTE — Discharge Instructions (Signed)
Take prednisone as prescribed until all gone, next dose tomorrow. Take Robitussin as prescribed for cough. Use inhaler 2 puffs every 4 hours. Make sure to drink plenty of fluids and rest. Follow-up with a family doctor or an urgent care. Return if worsening symptoms. History of screening and your chest x-ray are normal today.  Acute Bronchitis Bronchitis is inflammation of the airways that extend from the windpipe into the lungs (bronchi). The inflammation often causes mucus to develop. This leads to a cough, which is the most common symptom of bronchitis.  In acute bronchitis, the condition usually develops suddenly and goes away over time, usually in a couple weeks. Smoking, allergies, and asthma can make bronchitis worse. Repeated episodes of bronchitis may cause further lung problems.  CAUSES Acute bronchitis is most often caused by the same virus that causes a cold. The virus can spread from person to person (contagious) through coughing, sneezing, and touching contaminated objects. SIGNS AND SYMPTOMS   Cough.   Fever.   Coughing up mucus.   Body aches.   Chest congestion.   Chills.   Shortness of breath.   Sore throat.  DIAGNOSIS  Acute bronchitis is usually diagnosed through a physical exam. Your health care provider will also ask you questions about your medical history. Tests, such as chest X-rays, are sometimes done to rule out other conditions.  TREATMENT  Acute bronchitis usually goes away in a couple weeks. Oftentimes, no medical treatment is necessary. Medicines are sometimes given for relief of fever or cough. Antibiotic medicines are usually not needed but may be prescribed in certain situations. In some cases, an inhaler may be recommended to help reduce shortness of breath and control the cough. A cool mist vaporizer may also be used to help thin bronchial secretions and make it easier to clear the chest.  HOME CARE INSTRUCTIONS  Get plenty of rest.   Drink  enough fluids to keep your urine clear or pale yellow (unless you have a medical condition that requires fluid restriction). Increasing fluids may help thin your respiratory secretions (sputum) and reduce chest congestion, and it will prevent dehydration.   Take medicines only as directed by your health care provider.  If you were prescribed an antibiotic medicine, finish it all even if you start to feel better.  Avoid smoking and secondhand smoke. Exposure to cigarette smoke or irritating chemicals will make bronchitis worse. If you are a smoker, consider using nicotine gum or skin patches to help control withdrawal symptoms. Quitting smoking will help your lungs heal faster.   Reduce the chances of another bout of acute bronchitis by washing your hands frequently, avoiding people with cold symptoms, and trying not to touch your hands to your mouth, nose, or eyes.   Keep all follow-up visits as directed by your health care provider.  SEEK MEDICAL CARE IF: Your symptoms do not improve after 1 week of treatment.  SEEK IMMEDIATE MEDICAL CARE IF:  You develop an increased fever or chills.   You have chest pain.   You have severe shortness of breath.  You have bloody sputum.   You develop dehydration.  You faint or repeatedly feel like you are going to pass out.  You develop repeated vomiting.  You develop a severe headache. MAKE SURE YOU:   Understand these instructions.  Will watch your condition.  Will get help right away if you are not doing well or get worse.   This information is not intended to replace advice given to  you by your health care provider. Make sure you discuss any questions you have with your health care provider.   Document Released: 06/09/2004 Document Revised: 05/23/2014 Document Reviewed: 10/23/2012 Elsevier Interactive Patient Education [employer].

## 2015-06-13 NOTE — ED Provider Notes (Signed)
CSN: [id number]     Arrival date & time 06/13/15  1657 History   First MD Initiated Contact with Patient 06/13/15 1716     Chief Complaint  Patient presents with  . Cough     (Consider location/radiation/quality/duration/timing/severity/associated sxs/prior Treatment) HPI Craig Gillespie is a 45 y.o. male with hx of htn, presents to ED with complaint of sore throat, cough, body aches, chills. Pt is arabic speaking only, interpreter phone was used. Patient reports symptoms started 1 week ago. Patient states symptoms started with nasal congestion and sore throat. He was evaluated here, diagnosed with a viral infection, started on ibuprofen for his symptoms. Patient states he has been taking ibuprofen, however his symptoms are not improving. He reports that now he has more of a cough. He reports he is coughing up large amount of clear sputum. He states that he sometimes has coughing attacks and cannot stop coughing for several minutes. He continues to have body aches and chills. He did not take her temperature at home. He continues to have sore throat but states it is improving. He denies any difficulty swallowing, and denies any shortness of breath. Patient is a smoker. No history of lung problems. He does have history of wheezing and does have an inhaler at home that he used 2 days ago once. He states it did help some. Patient does not have a primary care doctor at this time. Pt states "i think i need an antibiotic."  Past Medical History  Diagnosis Date  . Hypertension    History reviewed. No pertinent past surgical history. No family history on file. Social History  Substance Use Topics  . Smoking status: Current Some Day Smoker  . Smokeless tobacco: None  . Alcohol Use: No    Review of Systems  Constitutional: Positive for chills.  HENT: Positive for congestion, sinus pressure and sore throat. Negative for ear pain.   Respiratory: Positive for cough. Negative for chest tightness.    Cardiovascular: Negative for chest pain, palpitations and leg swelling.  Gastrointestinal: Negative for nausea, vomiting, abdominal pain, diarrhea and abdominal distention.  Genitourinary: Negative for dysuria and urgency.  Musculoskeletal: Positive for myalgias. Negative for arthralgias, neck pain and neck stiffness.  Skin: Negative for rash.  Allergic/Immunologic: Negative for immunocompromised state.  Neurological: Positive for headaches. Negative for dizziness, weakness, light-headedness and numbness.      Allergies  Review of patient's allergies indicates no known allergies.  Home Medications   Prior to Admission medications   Medication Sig Start Date End Date Taking? Authorizing Provider  albuterol (PROVENTIL HFA;VENTOLIN HFA) 108 (90 BASE) MCG/ACT inhaler Inhale 2 puffs into the lungs every 4 (four) hours as needed for wheezing or shortness of breath. 04/07/15   Tharon Aquas, PA  azithromycin (ZITHROMAX) 250 MG tablet Take 1 tablet (250 mg total) by mouth daily. Take first 2 tablets together, then 1 every day until finished. 04/07/15   Tharon Aquas, PA  ibuprofen (ADVIL,MOTRIN) 600 MG tablet Take 1 tablet (600 mg total) by mouth every 6 (six) hours as needed. 06/07/15   Heather Laisure, PA-C  meclizine (ANTIVERT) 25 MG tablet Take 1 tablet (25 mg total) by mouth 3 (three) times daily as needed for dizziness. 02/07/15   Dione Booze, MD   BP 116/73 mmHg  Pulse 87  Temp(Src) 97.2 F (36.2 C) (Oral)  Resp 20  SpO2 97% Physical Exam  ED Course  Procedures (including critical care time) Labs Review Labs Reviewed  RAPID STREP SCREEN (NOT  AT [facility])  CULTURE, GROUP A STREP [facility])    Imaging Review Dg Chest 2 View  06/13/2015  CLINICAL DATA:  Progressive cough and sore throat for 3 days. EXAM: CHEST  2 VIEW COMPARISON:  None. FINDINGS: The heart size and mediastinal contours are within normal limits. Both lungs are clear. The visualized skeletal structures are  unremarkable. IMPRESSION: No active cardiopulmonary disease. Electronically Signed   By: Myles Rosenthal M.D.   On: 06/13/2015 17:38   I have personally reviewed and evaluated these images and lab results as part of my medical decision-making.   EKG Interpretation None      MDM   Final diagnoses:  Bronchitis    Patient with persistent cough, sore throat, body aches, chills for a week. He is a smoker. Negative strep screen and chest x-ray here. These were ordered by triage RN. His vital signs are normal. Oxygen saturation is 97% on room air, normal blood pressure, heart rate, respiratory rate, temperature. Patient is nontoxic. I suspect the patient still has a viral uri vs bronchitis. Patient has a dry, persistent/spastic cough during my exam. We'll treat with inhaler, 2 puffs every 4 hours. Prednisone. Robitussin. I suspect this is still a viral upper respiratory infection and discussed symptomatic treatment with patient. We did discuss close return precautions and close follow-up. Patient voiced understanding. Discussed stopping smoking cessation.   Filed Vitals:   06/13/15 1704  BP: 116/73  Pulse: 87  Temp: 97.2 F (36.2 C)  TempSrc: Oral  Resp: 20  SpO2: 97%       Jaynie Crumble, PA-C 06/13/15 1821  Laurence Spates, MD 06/13/15 [phone]

## 2015-06-13 NOTE — ED Notes (Signed)
1 week ago came here only given medicine for pain. Pt c/o sore throat, cough with yellow secretions. Pt requesting medications for the cough and infection.

## 2015-06-13 NOTE — ED Notes (Signed)
Patient transported to X-ray 

## 2015-06-16 LAB — CULTURE, GROUP A STREP (THRC)

## 2015-06-25 ENCOUNTER — Encounter (HOSPITAL_COMMUNITY): Payer: Self-pay | Admitting: Emergency Medicine

## 2015-06-25 ENCOUNTER — Emergency Department (INDEPENDENT_AMBULATORY_CARE_PROVIDER_SITE_OTHER)
Admission: EM | Admit: 2015-06-25 | Discharge: 2015-06-25 | Disposition: A | Discharge status: Home / Self Care | Payer: Medicaid Other | Source: Home / Self Care | Attending: Family Medicine | Admitting: Family Medicine

## 2015-06-25 DIAGNOSIS — J40 Bronchitis, not specified as acute or chronic: Secondary | ICD-10-CM | POA: Diagnosis not present

## 2015-06-25 MED ORDER — DOXYCYCLINE HYCLATE 100 MG PO CAPS: 100.0000 mg | ORAL_CAPSULE | Freq: Two times a day (BID) | ORAL | Status: DC

## 2015-06-25 NOTE — Discharge Instructions (Signed)

## 2015-06-25 NOTE — ED Provider Notes (Signed)
CSN: [id number]     Arrival date & time 06/25/15  1531 History   First MD Initiated Contact with Patient 06/25/15 1745     No chief complaint on file.  (Consider location/radiation/quality/duration/timing/severity/associated sxs/prior Treatment) Patient is a 45 y.o. male presenting with cough. The history is provided by the patient. No language interpreter was used.  Cough Cough characteristics:  Productive Sputum characteristics:  Nondescript Severity:  Moderate Onset quality:  Gradual Timing:  Constant Progression:  Worsening Chronicity:  New Smoker: no   Relieved by:  Nothing Worsened by:  Nothing tried Ineffective treatments:  None tried Associated symptoms: no chest pain   Pt complains of chest congestion.  Pt reports he has had a cough since January.  Pt reports he is getting worse.  Pt is coughing up colored phelgm.    Past Medical History  Diagnosis Date  . Hypertension    History reviewed. No pertinent past surgical history. No family history on file. Social History  Substance Use Topics  . Smoking status: Current Some Day Smoker  . Smokeless tobacco: None  . Alcohol Use: No    Review of Systems  Respiratory: Positive for cough.   Cardiovascular: Negative for chest pain.  All other systems reviewed and are negative.   Allergies  Review of patient's allergies indicates no known allergies.  Home Medications   Prior to Admission medications   Medication Sig Start Date End Date Taking? Authorizing Provider  albuterol (PROVENTIL HFA;VENTOLIN HFA) 108 (90 BASE) MCG/ACT inhaler Inhale 2 puffs into the lungs every 4 (four) hours as needed for wheezing or shortness of breath. 04/07/15   Tharon Aquas, PA  azithromycin (ZITHROMAX) 250 MG tablet Take 1 tablet (250 mg total) by mouth daily. Take first 2 tablets together, then 1 every day until finished. Patient not taking: Reported on 06/25/2015 04/07/15   Tharon Aquas, PA  doxycycline (VIBRAMYCIN) 100 MG capsule  Take 1 capsule (100 mg total) by mouth 2 (two) times daily. 06/25/15   Elson Areas, PA-C  guaiFENesin-dextromethorphan (ROBITUSSIN DM) 100-10 MG/5ML syrup Take 5 mLs by mouth every 4 (four) hours as needed for cough. 06/13/15   Tatyana Kirichenko, PA-C  ibuprofen (ADVIL,MOTRIN) 600 MG tablet Take 1 tablet (600 mg total) by mouth every 6 (six) hours as needed. 06/07/15   Heather Laisure, PA-C  meclizine (ANTIVERT) 25 MG tablet Take 1 tablet (25 mg total) by mouth 3 (three) times daily as needed for dizziness. 02/07/15   Dione Booze, MD  predniSONE (DELTASONE) 10 MG tablet Take 2 tablets (20 mg total) by mouth daily. Patient not taking: Reported on 06/25/2015 06/13/15   Jaynie Crumble, PA-C   Meds Ordered and Administered this Visit  Medications - No data to display  BP 121/86 mmHg  Pulse 75  Temp(Src) 98.2 F (36.8 C) (Oral)  Resp 16  SpO2 98% No data found.   Physical Exam  Constitutional: He is oriented to person, place, and time. He appears well-developed and well-nourished.  HENT:  Head: Normocephalic and atraumatic.  Right Ear: External ear normal.  Mouth/Throat: Oropharynx is clear and moist.  Eyes: Conjunctivae and EOM are normal. Pupils are equal, round, and reactive to light.  Neck: Normal range of motion.  Cardiovascular: Normal rate and normal heart sounds.   Pulmonary/Chest: Effort normal.  Abdominal: He exhibits no distension.  Musculoskeletal: Normal range of motion.  Neurological: He is alert and oriented to person, place, and time.  Skin: Skin is warm.  Psychiatric: He has a  normal mood and affect.  Nursing note and vitals reviewed.   ED Course  Procedures (including critical care time)  Labs Review Labs Reviewed - No data to display  Imaging Review No results found.   Visual Acuity Review  Right Eye Distance:   Left Eye Distance:   Bilateral Distance:    Right Eye Near:   Left Eye Near:    Bilateral Near:         MDM   1. Bronchitis     Meds ordered this encounter  Medications  . doxycycline (VIBRAMYCIN) 100 MG capsule    Sig: Take 1 capsule (100 mg total) by mouth 2 (two) times daily.    Dispense:  20 capsule    Refill:  0    Order Specific Question:  Supervising Provider    Answer:  Linna Hoff [phone]   An After Visit Summary was printed and given to the patient.   Lonia Skinner [place], PA-C 06/25/15 1950

## 2015-06-25 NOTE — ED Notes (Signed)
Reports a 2 week history of illness.  Patient has been seen in ed.  Reports cough, yellow phlegm, fever 2 days ago, sob 2 days ago.

## 2016-03-14 ENCOUNTER — Ambulatory Visit (HOSPITAL_COMMUNITY)
Admission: EM | Admit: 2016-03-14 | Discharge: 2016-03-14 | Disposition: A | Discharge status: Home / Self Care | Payer: Medicaid Other | Attending: Emergency Medicine | Admitting: Emergency Medicine

## 2016-03-14 ENCOUNTER — Encounter (HOSPITAL_COMMUNITY): Payer: Self-pay | Admitting: Family Medicine

## 2016-03-14 DIAGNOSIS — L309 Dermatitis, unspecified: Secondary | ICD-10-CM | POA: Diagnosis not present

## 2016-03-14 DIAGNOSIS — Z9109 Other allergy status, other than to drugs and biological substances: Secondary | ICD-10-CM | POA: Diagnosis not present

## 2016-03-14 MED ORDER — FLUTICASONE PROPIONATE 50 MCG/ACT NA SUSP: 2.0000 | Freq: Every day | NASAL | 2 refills | Status: AC

## 2016-03-14 MED ORDER — LORATADINE 10 MG PO TABS: 10.0000 mg | ORAL_TABLET | Freq: Every day | ORAL | 0 refills | Status: AC

## 2016-03-14 MED ORDER — METHYLPREDNISOLONE 4 MG PO TBPK: ORAL_TABLET | ORAL | 0 refills | Status: DC

## 2016-03-14 MED ORDER — OLOPATADINE HCL 0.1 % OP SOLN: 1.0000 [drp] | Freq: Two times a day (BID) | OPHTHALMIC | 0 refills | Status: AC

## 2016-03-14 NOTE — Discharge Instructions (Signed)
Avoid washing your hands with very hot water for prolonged periods of time. Use Eucerin cream after you wash your hands each time. You may want to put the lotion on and then have some cotton gloves on your hands at night to help promote healing.

## 2016-03-14 NOTE — ED Triage Notes (Signed)
Pt here for eczema to feet and hands worsening since the cold weather. Pt also has spots underneath eyes.

## 2016-03-14 NOTE — ED Provider Notes (Signed)
HPI  SUBJECTIVE:  Craig Gillespie is a 45 y.o. male who presents with a painful, burning, intensely itchy rash on his left hand and on his right toe for the past 2 months. He reports occasional weeping but no crusting. He tried warm salt water soaks without improvement. There are no other aggravating or alleviating factors. He states that this was triggered by exposure to a known food allergen. He denies any exposure to chemicals, soaps, detergents. No swelling or redness. Patient denies any symptoms of infection. He also reports clear rhinorrhea, itchy, watery eyes, sneezing starting yesterday with the change in weather. Symptoms are worse with going outside. No alleviating factors. He has not tried anything for this. He has a past medical history of multiple food allergies and eczema. He states that this is identical to previous episodes of eczema. No history of diabetes or hypertension. PMD: [facility].  All history obtained through the video language line.    Past Medical History:  Diagnosis Date  . Hypertension     History reviewed. No pertinent surgical history.  History reviewed. No pertinent family history.  Social History  Substance Use Topics  . Smoking status: Current Some Day Smoker  . Smokeless tobacco: Never Used  . Alcohol use No    No current facility-administered medications for this encounter.   Current Outpatient Prescriptions:  .  fluticasone (FLONASE) 50 MCG/ACT nasal spray, Place 2 sprays into both nostrils daily., Disp: 16 g, Rfl: 2 .  loratadine (CLARITIN) 10 MG tablet, Take 1 tablet (10 mg total) by mouth daily., Disp: 30 tablet, Rfl: 0 .  methylPREDNISolone (MEDROL DOSEPAK) 4 MG TBPK tablet, Take as directed on packet, Disp: 21 tablet, Rfl: 0 .  olopatadine (PATANOL) 0.1 % ophthalmic solution, Place 1 drop into both eyes 2 (two) times daily., Disp: 5 mL, Rfl: 0  No Known Allergies   ROS  As noted in HPI.   Physical  Exam  BP 130/78 (BP Location: Left Arm)   Pulse 83   Temp 98 F (36.7 C) (Oral)   Resp 16   SpO2 97%   Constitutional: Well developed, well nourished, no acute distress Eyes:  EOMI, conjunctiva normal bilaterally HENT: Normocephalic, atraumatic,mucus membranes moist. Mild bilateral conjunctival injection. Clear rhinorrhea. Normal oropharynx. Positive postnasal drip. Respiratory: Normal inspiratory effort lungs clear bilaterally Cardiovascular: Normal rate regular rhythm no murmurs rubs gallops GI: nondistended skin: Dry, cracked skin with excoriations and fissures on his left hand. See pictures.    Musculoskeletal: no deformities Neurologic: Alert & oriented x 3, no focal neuro deficits Psychiatric: Speech and behavior appropriate   ED Course   Medications - No data to display  No orders of the defined types were placed in this encounter.   No results found for this or any previous visit (from the past 24 hour(s)). No results found.  ED Clinical Impression  Eczema, unspecified type  Environmental allergies   ED Assessment/Plan  Presentation consistent with eczema. He does not appear to be any secondary infection at this time. Plan sent home with Medrol Dosepak, Eucerin. He also appears to have some seasonal allergies. We'll send home with Flonase, Claritin. He is reporting itchy, watery eyes, so we'll also send home with some Patanol if the Flonase, Claritin do not work. He'll follow-up with his primary care physician as needed.  Using the video translator, Discussed  MDM, plan and followup with patient .  Patient agrees with plan.   Meds ordered this encounter  Medications  .  methylPREDNISolone (MEDROL DOSEPAK) 4 MG TBPK tablet    Sig: Take as directed on packet    Dispense:  21 tablet    Refill:  0  . fluticasone (FLONASE) 50 MCG/ACT nasal spray    Sig: Place 2 sprays into both nostrils daily.    Dispense:  16 g    Refill:  2  . olopatadine (PATANOL) 0.1 %  ophthalmic solution    Sig: Place 1 drop into both eyes 2 (two) times daily.    Dispense:  5 mL    Refill:  0  . loratadine (CLARITIN) 10 MG tablet    Sig: Take 1 tablet (10 mg total) by mouth daily.    Dispense:  30 tablet    Refill:  0    *This clinic note was created using Scientist, clinical (histocompatibility and immunogenetics)Dragon dictation software. Therefore, there may be occasional mistakes despite careful proofreading.  ?   Domenick GongAshley Dia Donate, MD 03/14/16 2029

## 2016-03-28 IMAGING — DX DG CHEST 2V
2 series · 2 of 2 positions shown · non-contrast
Comparison: None.

CLINICAL DATA: Progressive cough and sore throat for 3 days.

EXAM:
CHEST  2 VIEW

[w chest pa]
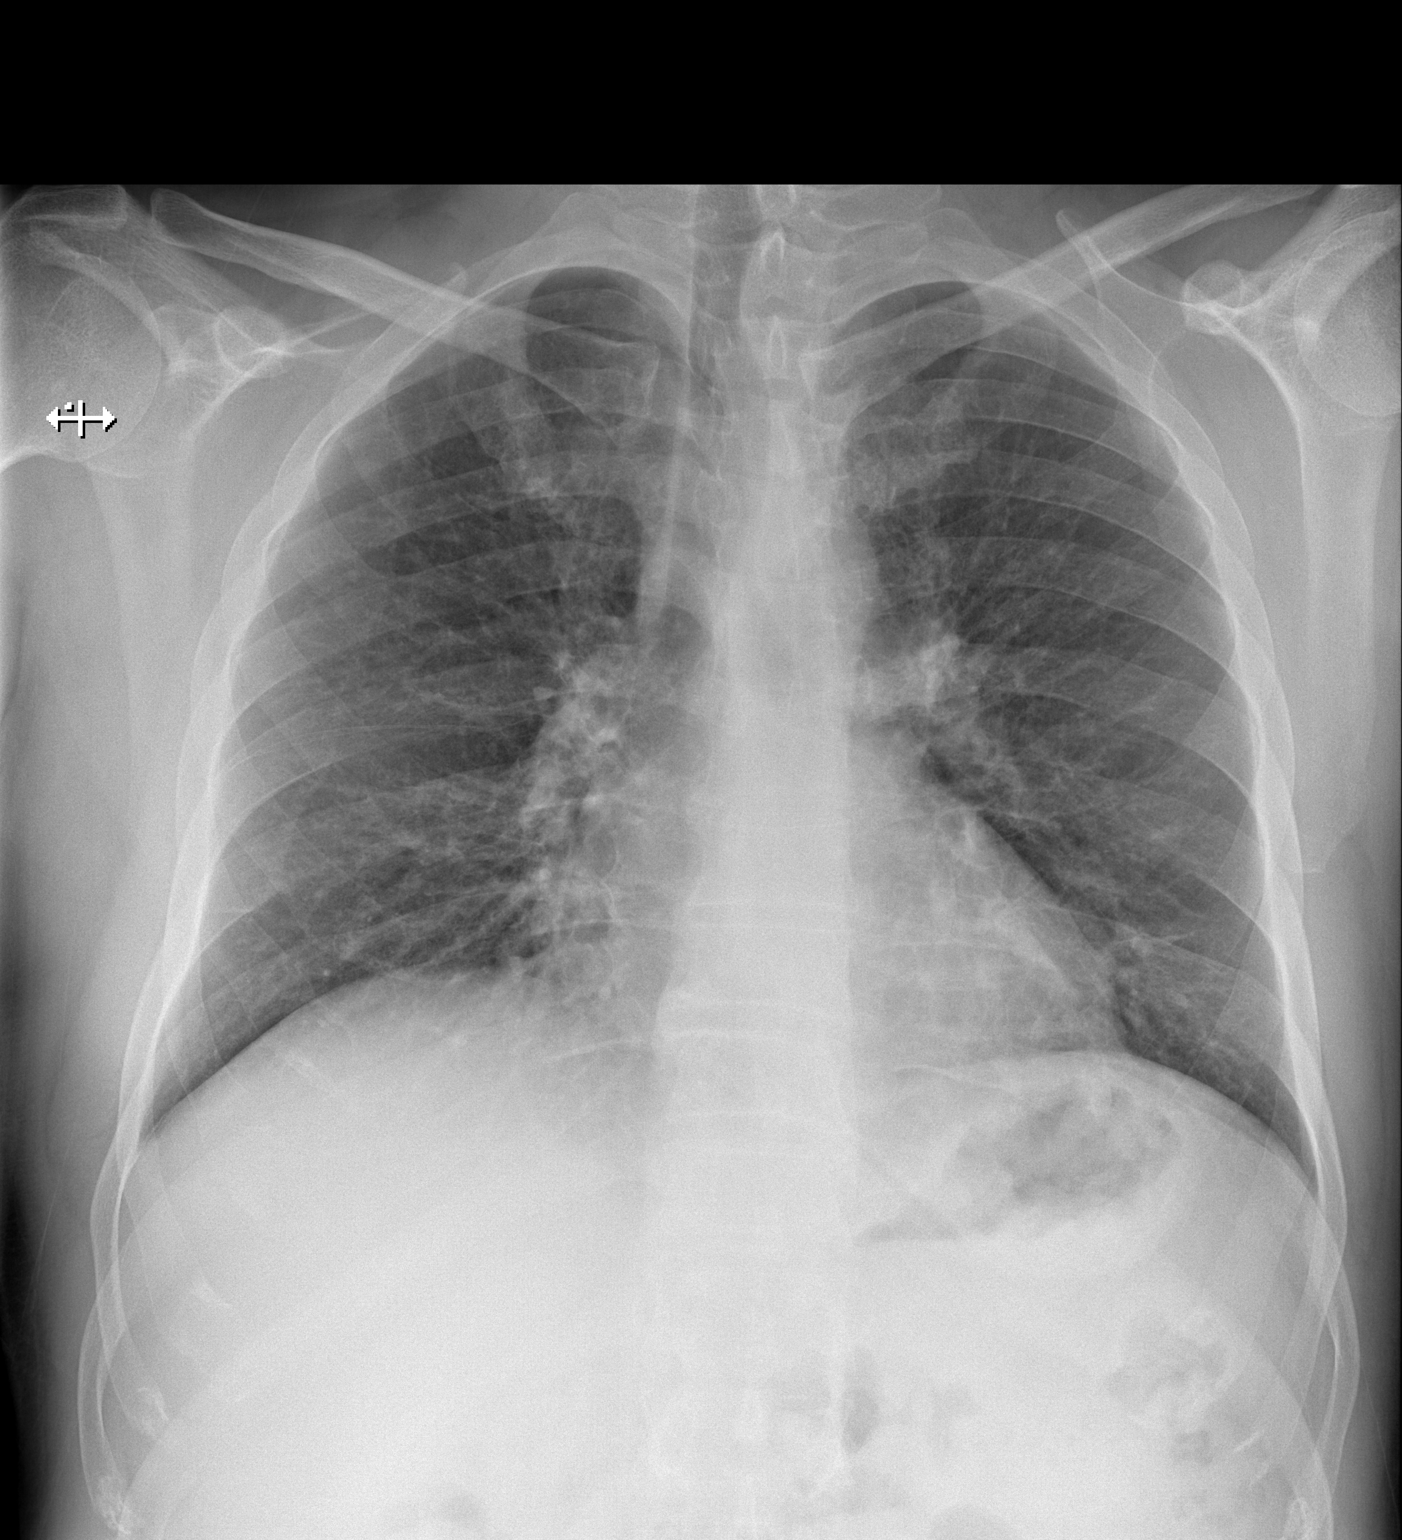

[w chest lat]
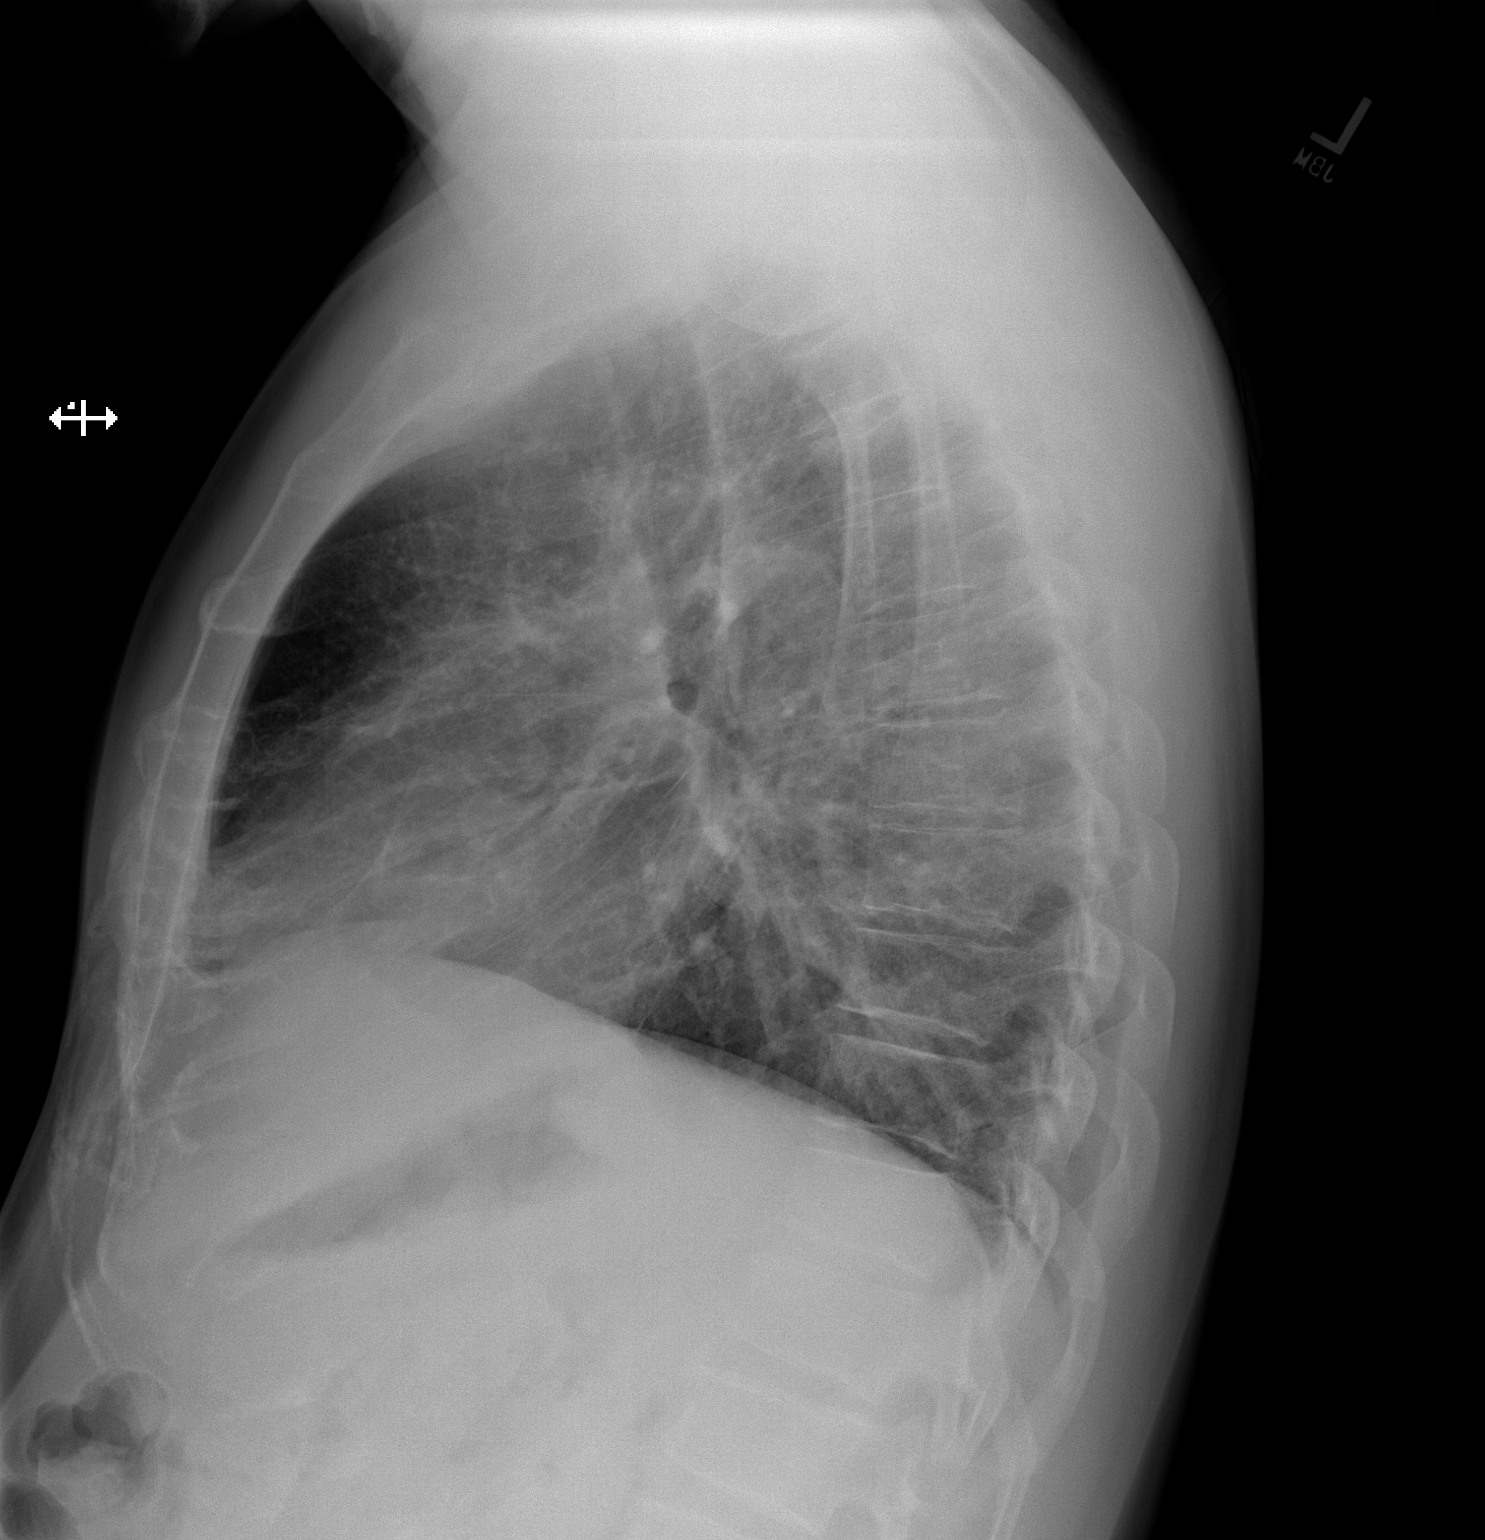

[2 of 2 positions shown; findings below may reference images not displayed]

FINDINGS: The heart size and mediastinal contours are within normal limits.
Both lungs are clear. The visualized skeletal structures are
unremarkable.
IMPRESSION: No active cardiopulmonary disease.

## 2017-05-04 ENCOUNTER — Ambulatory Visit (HOSPITAL_COMMUNITY): Admission: EM | Admit: 2017-05-04 | Discharge: 2017-05-04 | Discharge status: Absent without leave | Payer: Self-pay

## 2017-06-21 ENCOUNTER — Ambulatory Visit (HOSPITAL_COMMUNITY)
Admission: EM | Admit: 2017-06-21 | Discharge: 2017-06-21 | Discharge status: Against Medical Advice | Payer: Medicaid Other

## 2017-06-21 ENCOUNTER — Emergency Department (HOSPITAL_COMMUNITY): Admission: EM | Admit: 2017-06-21 | Discharge: 2017-06-21 | Discharge status: Absent without leave | Payer: Self-pay

## 2017-06-21 NOTE — ED Notes (Signed)
Per pt access pt left the building.

## 2017-07-31 ENCOUNTER — Encounter: Payer: Self-pay | Admitting: Internal Medicine

## 2017-07-31 ENCOUNTER — Ambulatory Visit: Payer: Self-pay | Admitting: Internal Medicine

## 2017-07-31 VITALS — BP 122/78 | HR 74 | Resp 12 | Ht 68.0 in | Wt 212.0 lb

## 2017-07-31 DIAGNOSIS — M545 Low back pain, unspecified: Secondary | ICD-10-CM

## 2017-07-31 DIAGNOSIS — L309 Dermatitis, unspecified: Secondary | ICD-10-CM | POA: Insufficient documentation

## 2017-07-31 DIAGNOSIS — B349 Viral infection, unspecified: Secondary | ICD-10-CM

## 2017-07-31 DIAGNOSIS — Z8639 Personal history of other endocrine, nutritional and metabolic disease: Secondary | ICD-10-CM

## 2017-07-31 MED ORDER — PREDNISONE 10 MG (21) PO TBPK: ORAL_TABLET | ORAL | 0 refills | Status: DC

## 2017-07-31 MED ORDER — CYCLOBENZAPRINE HCL 10 MG PO TABS
ORAL_TABLET | ORAL | 0 refills | Status: DC
Start: 2017-07-31 — End: 2017-08-04

## 2017-07-31 NOTE — Progress Notes (Signed)
Subjective:    Patient ID: Craig PeersBassam Gillespie, male    DOB: 01/12/1971, 47 y.o.   MRN: [id number]  HPI   Here to establish [facility]ryne [facility]oor [facility]  Phone:  [phone], interprets via phone.  She is his caseworker.  1.  Low back pain:  More than 15 days.  Has been out of work the same time period.   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  Lot of bending, lifting, standing with his position. Back pain came on gradually and is gradually increasing.  Pain is constant. No history of injury prior to this pain. Has not had this pain in the past  Bilateral lower back pain with radiation up his right back.   Pain can radiate also in upper lateral thighs of both legs--this pain is like a "burning fire". He feels his legs are weak with this pain.   He does get numbness and tingling into his legs--also to same level down to lower thighs bilaterally. May have foot drop, dragging his toe, both sides.   No difficulties with controlling bowel or bladder.  Unknown OTC pain medication.  Helps a little bit.  Improvement lasts about 1-2 hours.  Has not taken anything for 4-5 days as did not seem to be helping long term.  2.  2 days ago, fever and chills.  No longer with those.  Having coughing with yellow sputum and gets sweaty.   No dyspnea.   Denies nasal congestion.  + ear pain, + headache.  + posterior pharyngeal drainage. Has been taking unknown cold remedy that has helped a little.    3.  Difficulties with rent:  Pays $700/month and has not been able to afford for past 2 months.. No outpatient medications have been marked as taking for the 07/31/17 encounter (Office Visit) with Julieanne MansonMulberry, Ferdinand Revoir, MD.    No Known Allergies   Social History   Socioeconomic History  . Marital status: Married    Spouse name: Not on file  . Number of children: Not on file  . Years of education: Not on file  . Highest education level: Not on file  Social Needs  . Financial resource strain: Not on file    . Food insecurity - worry: Not on file  . Food insecurity - inability: Not on file  . Transportation needs - medical: Not on file  . Transportation needs - non-medical: Not on file  Occupational History  . Not on file  Tobacco Use  . Smoking status: Current Some Day Smoker  . Smokeless tobacco: Never Used  Substance and Sexual Activity  . Alcohol use: No  . Drug use: No  . Sexual activity: Not on file  Other Topics Concern  . Not on file  Social History Narrative  . Not on file         Review of Systems     Objective:   Physical Exam NAD, congested HEENT: PERRL, EOMI, TMs dull with fluid behind.  Uvula mildly edematous, no exudate.  Nasal mucosa with mild swelling and erythema. Neck:  Supple, mild cervical adenopathy, NT Chest:  CTA MS:  Tender over spinous processes of LS spine.  Tender mildly of right paraspinous musculature to upper thoracic area.  Tender bilaterally over greater trochanters.  Minimal discomfort with straight leg raise.   Neuro:  A & O x 3, CN II-XII grossly intact.  Motor 5/5, DTRs 2+/4, Sensory normal save for loss of pinprick sensation on anterolateral thigh bilaterally.  No foot  drop       Assessment & Plan:  1.  Lumbar pain with radiculopathy:  Suspect the pain radiating up back due to change in mechanics of how he moves. He does have a history of one elevated serum glucose in the past, so will recheck CMP as starting a prednisone burst and taper. 40 mg daily for 4 days, then reduce daily by 5 mg until off on Day 12. Cyclobenzaprine 10 mg every 8 hours, mainly before bedtime.  2.  Viral URI:  Supportive care.  To call if worsens. CBC before staring prednisone.  3.  History of hyperglycemia:  As above.  4.  Housing issues:  Looking into some funding through churches involved with the move of people from the housing unit on [address].

## 2017-07-31 NOTE — Patient Instructions (Signed)
Prednisone 10 mg tabs:  Day 1 through 4:  4 tabs by mouth daily in the morning. Day 5:  3 1/2 tabs Day 6:  3 tabs Day 7:  2 1/2 tabs DAy 8:  2 tabs Day 9:  1 1/2 tabs Day 10:  1 tab Day 11:  1/2 tab Day 12:  Done  Use Cyclobenzaprine really only at night.

## 2017-08-01 LAB — CBC WITH DIFFERENTIAL/PLATELET
BASOS: 1 %
Basophils Absolute: 0.1 10*3/uL (ref 0.0–0.2)
EOS (ABSOLUTE): 0.3 10*3/uL (ref 0.0–0.4)
EOS: 3 %
HEMATOCRIT: 44.3 % (ref 37.5–51.0)
HEMOGLOBIN: 14.9 g/dL (ref 13.0–17.7)
IMMATURE GRANS (ABS): 0 10*3/uL (ref 0.0–0.1)
Immature Granulocytes: 0 %
LYMPHS ABS: 3.9 10*3/uL — AB (ref 0.7–3.1)
LYMPHS: 44 %
MCH: 28.7 pg (ref 26.6–33.0)
MCHC: 33.6 g/dL (ref 31.5–35.7)
MCV: 85 fL (ref 79–97)
MONOCYTES: 9 %
Monocytes Absolute: 0.8 10*3/uL (ref 0.1–0.9)
Neutrophils Absolute: 3.9 10*3/uL (ref 1.4–7.0)
Neutrophils: 43 %
Platelets: 566 10*3/uL — ABNORMAL HIGH (ref 150–379)
RBC: 5.19 x10E6/uL (ref 4.14–5.80)
RDW: 14.1 % (ref 12.3–15.4)
WBC: 9 10*3/uL (ref 3.4–10.8)

## 2017-08-01 LAB — COMPREHENSIVE METABOLIC PANEL
A/G RATIO: 1.1 — AB (ref 1.2–2.2)
ALBUMIN: 4 g/dL (ref 3.5–5.5)
ALT: 56 IU/L — ABNORMAL HIGH (ref 0–44)
AST: 20 IU/L (ref 0–40)
Alkaline Phosphatase: 231 IU/L — ABNORMAL HIGH (ref 39–117)
BILIRUBIN TOTAL: 0.2 mg/dL (ref 0.0–1.2)
BUN / CREAT RATIO: 10 (ref 9–20)
BUN: 15 mg/dL (ref 6–24)
CHLORIDE: 102 mmol/L (ref 96–106)
CO2: 20 mmol/L (ref 20–29)
Calcium: 9.4 mg/dL (ref 8.7–10.2)
Creatinine, Ser: 1.44 mg/dL — ABNORMAL HIGH (ref 0.76–1.27)
GFR calc non Af Amer: 57 mL/min/{1.73_m2} — ABNORMAL LOW (ref >59)
GFR, EST AFRICAN AMERICAN: 66 mL/min/{1.73_m2} (ref >59)
GLOBULIN, TOTAL: 3.5 g/dL (ref 1.5–4.5)
GLUCOSE: 77 mg/dL (ref 65–99)
Potassium: 5.1 mmol/L (ref 3.5–5.2)
SODIUM: 140 mmol/L (ref 134–144)
TOTAL PROTEIN: 7.5 g/dL (ref 6.0–8.5)

## 2017-08-02 NOTE — Progress Notes (Signed)
Referral, demographics and ov notes faxed to [facility]. Facility will contact patient to schedule appointment.

## 2017-08-04 ENCOUNTER — Ambulatory Visit: Payer: Self-pay | Admitting: Licensed Clinical Social Worker

## 2017-08-04 ENCOUNTER — Telehealth: Payer: Self-pay | Admitting: Internal Medicine

## 2017-08-04 DIAGNOSIS — Z599 Problem related to housing and economic circumstances, unspecified: Secondary | ICD-10-CM

## 2017-08-04 DIAGNOSIS — Z603 Acculturation difficulty: Secondary | ICD-10-CM

## 2017-08-04 MED ORDER — CYCLOBENZAPRINE HCL 10 MG PO TABS: ORAL_TABLET | ORAL | 0 refills | Status: AC

## 2017-08-04 MED ORDER — PREDNISONE 10 MG PO TABS
ORAL_TABLET | ORAL | 0 refills | Status: AC
Start: 2017-08-04 — End: ?

## 2017-08-04 NOTE — Telephone Encounter (Signed)
Discussed with Angelica [place] MSW intern. Would like further evaluation of mental health before considering antidepressant--has a lot of anger issues and want to be clear there is no manic component.

## 2017-08-11 ENCOUNTER — Ambulatory Visit: Payer: Self-pay | Admitting: Internal Medicine

## 2017-08-11 ENCOUNTER — Encounter: Payer: Self-pay | Admitting: Internal Medicine

## 2017-08-11 VITALS — BP 120/82 | HR 76 | Resp 12 | Ht 68.0 in | Wt 214.0 lb

## 2017-08-11 DIAGNOSIS — R7989 Other specified abnormal findings of blood chemistry: Secondary | ICD-10-CM

## 2017-08-11 DIAGNOSIS — M545 Low back pain, unspecified: Secondary | ICD-10-CM

## 2017-08-11 DIAGNOSIS — Z8639 Personal history of other endocrine, nutritional and metabolic disease: Secondary | ICD-10-CM

## 2017-08-11 DIAGNOSIS — R748 Abnormal levels of other serum enzymes: Secondary | ICD-10-CM

## 2017-08-11 NOTE — Progress Notes (Deleted)
Patient ID: Craig PeersBassam Gillespie, male   DOB: August 10, 1970, 47 y.o.   MRN: [id number]

## 2017-08-11 NOTE — Progress Notes (Deleted)
   THERAPY PROGRESS NOTE  Session Time: 60 mins  Participation Level: {BHH PARTICIPATION LEVEL:22264}  Behavioral Response: {Appearance:22683}{BHH LEVEL OF CONSCIOUSNESS:22305}{BHH MOOD:22306}  Type of Therapy: {CHL AMB BH Type of Therapy:21022741}  Treatment Goals addressed: {CHL AMB BH Treatment Goals Addressed:21022754}  Interventions: {CHL AMB BH Type of Intervention:21022753}  Summary: Craig Gillespie is a 47 y.o. male who presents with ***.   Suicidal/Homicidal: {BHH YES OR NO:22294}{yes/no/with/without intent/plan:22693}  Therapist Response: ***  Plan: Return again in *** weeks.  Diagnosis: Axis I: {psych axis 1:31909}    Axis II: {psych axis 2:31910}    Isel Skufca [place], Student-Social Work 08/11/2017

## 2017-08-11 NOTE — Progress Notes (Signed)
   Subjective:    Patient ID: Craig Gillespie, male    DOB: 05/25/1970, 47 y.o.   MRN: [id number]  HPI  1.  Back pain:  Did get the prednisone burst and taper.  He sounds like he is down to 10 mg today with the taper.  Cyclobenzaprine has helped as well.  Sleeping okay.  Back pain is better.   Noor states she has not received a phone call from PT in [place].   Discussed this is still important.    2.  Elevated liver enzymes:  No history of elevated liver enzymes.  No family history of hepatic illness. No history of liver infection with virus.  3.  Elevated Creatinine, mild:  No family history of kidney disease he is aware of.   Current Meds  Medication Sig  . cyclobenzaprine (FLEXERIL) 10 MG tablet 1/2 to 1 tab by mouth every 8 hours as needed especially at bedtime    No Known Allergies Review of Systems     Objective:   Physical Exam Moves easily today Minimal tenderness over lumbar right paraspinous musculature.   Motor 5/5 throughout, DTRs 2+/4 throughtout. Abd:  S, NT, No HSM or mass, + BS        Assessment & Plan:  1.  Low back pain, mainly right:  Good response to treatment.  Would like for him to still go to PT to learn exercises to prevent recurrence.  2.  Elevated Alk phos and ALT:  Check GGT if still significantly elevated.  3.  Elevated Crea:  Recheck today.  Mildly up at 1.44 11 days ago.  Check UA with microscopic  4.  Social issues:  Continues to struggle with employment, financial situation.  Refer to [facility] to see if qualifies for the program with refugees at high risk of continued difficulties.  Macie Burows, LCSW contacting.  Discussed with [facility] today as well.

## 2017-08-11 NOTE — Progress Notes (Signed)
Patient ID: Craig Gillespie, male   DOB: May 10, 1971, 47 y.o.   MRN: [id number]    DEMOGRAPHIC INFORMATION  Client name: Craig Gillespie Date of birth:   Email address:  Marital status: Married  Race: [employer]/grade or employment:   Scientist, research (physical sciences) guardian (if applicable):  Language preference: Arabic  Country of origin: [place] Time in [place]: Since [place], ages, relationships of everyone in the home:  Craig Gillespie- Wife  Craig Gillespie(son)- 32 Craig Gillespie (son)- 101 Craig Gillespie( daughter)-15   Number of sisters: Number of brothers: 3sisters 7 brothers * Lives in [place] & [place] Siblings/children not in the home:  0  Client raised by:  Parents Custodial status: N/A  Number of marriages:  1 Parents living/deceased/ health status: Deceased  Family functioning summary (quality of relationships, recent changes, etc):  Overall the family structure has had a difficult time communicating to each other and being cohesive unit due to financial stressors. Craig Gillespie discussed that his children have expressed to him wanting to spend more time together as a family unit.  However, due to the financial strain that has taken a toll on their household, Craig Gillespie refuses to spend time or talk to his family. Additionally, he discussed that he does not communicate with his extended family back home in [place] and [place] out of frustration.    Family history of mental health/substance abuse: Older brother was alcoholic    Where parents live: Relationship status: parents [place]  Married    PRESENTING CONCERNS AND SYMPTOMS (problems/symptoms, frequency of symptoms, triggers, family dynamics, etc.)    Craig Gillespie presented hesitation and reported having mistrust in health care services and providers based on past experiences since coming to [place]. Craig Gillespie however, still agrees to continue to have sessions and understands the need of his overall health. Craig Gillespie's views on [place] and the system have left  him very aggressive and angry towards people around him. Language is also a barrier and not understanding the laws of the land has left him in a state of frustration. He believes no one can offer him emotional support based on his cultural standard of role he plays as a father and husband in the household. Overall Craig Gillespie is having a hard time financially and emotionally taking care of his family. The concern is that due to his back injury he has been out of work for the past three weeks, which resulted in his boss letting him go.  Craig Gillespie has a difficult time adjusting [place] from [place]. He discussed his frustration in the system and how the language barrier has impacted his daily living and getting the services he needs. Craig Gillespie reports that he typically shuts everyone out when problems arise and isolates himself from his wife and family. Craig Gillespie isolating himself from his family has caused the rest of the family to feel alienated and cannot support him. Craig Gillespie says that he has trouble getting out of bed in the morning and has trouble sleeping at night, which is due to his racing thoughts and excessive worrying. He also reports that he gets very irritable with his kids and when he does he goes and sits in the car and smokes hookah.   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 His frustration with adjusting with [place] policies, system and difficulties around the language barrier. Has  caused him to become angry and a sense of confusion. His current health condition has resulted him in losing his job and not being able to provide for his family and being behind on rent. The effects of back payments may result in possibly losing their home and local resources they used from previous needs.   CURRENT SERVICES  RECEIVED   Dates from: Dates to: Facility/Provider: Type of service: Outcome/Follow-Up    Not Completed            PAST PSYCHIATRIC AND SUBSTANCE ABUSE TREATMENT HISTORY   Dates: from Dates: To Facility/Provider Tx Type   Outcome/Follow-up and Compliance    Not Completed                   SYMPTOMS (mark with X if present)  DEPRESSIVE SYMPTOMS  Sadness/crying/depressed mood: X     Suicidal thoughts:  Sleep disturbance: X   Irritability: X Worthlessness/guilt:    Anhedonia:  Psychomotor agitation/retardation:     Reduced appetite/weight loss:  Fatigue:    Increased appetite/weight gain:  Concentration/ memory problems: X    ANXIETY SYMPTOMS  Separation anxiety:  Obsessions/compulsions:     Selective mutism:  Agoraphobia symptoms:    Phobia:  Excessive anxiety/worry: X   Social anxiety:  Cannot control worry: X   Panic attacks:  Restlessness:    Irritability:  Muscle tension/sweating/nausea/trembling     ATTENTION SYMPTOMS  Avoids tasks that require mental effort:  Often loses things:    Makes careless mistakes:  Easily distracted by extraneous stimuli:    Difficulty sustaining attention:  Forgetful in daily activities:    Does not seem to listen when spoken to:  Fidgets/squirms:    Does not follow instructions/fails to finish:  Often leaves seat:    Messy/disorganized:  Runs or climbs when inappropriate:    Unable to play quietly:  "On the go"/ "Driven by a motor":    Talks excessively:  Blurts out answers before question:    Difficulty waiting his/her turn:  Interrupts or intrudes on others:     MANIC SYMPTOMS  Elevated, expansive or irritable mood:  Decreased need for sleep:    Abnormally increased goal-directed activity or energy:   Flight of ideas/racing thoughts: X   Inflated self-esteem/grandiosity:  High risk activities:     CONDUCT PROBLEMS  Sexually acting out:  Destruction of property/setting fires:                                      Lying/stealing:   Assault/fighting:    Gang involvement:  Explosive anger:    Argumentative/defiant:  Impulsivity:    Vindictive/malicious behavior:  Running away from home:     PSYCHOTIC SYMPTOMS  Delusions:                            Hallucinations:    Disorganized thinking/speech:  Disorganized or abnormal motor behavior:    Negative symptoms:  Catatonia:             TRAUMA CHECKLIST  Have you ever experienced the following? If yes, describe: (age of onset, duration, etc)  Have you ever been in a natural disaster, terrorist attack, or war? Not Completed   Have you ever been in a fire?   Have you ever been in a serious car accident?    Has there ever been a time when  you were seriously hurt or injured?   Have your parents or siblings ever been in the hospital for any serious or life-threatening problems?   Has anyone ever hit you or beaten you up?   Has anyone ever threatened to physically assault you?   Have you ever been hit or intentionally hurt by a family member? If yes, did you have bruises, marks or injuries?   Was there a time when adults who were supposed to be taking care of you didn't? (no clean clothes, no one to take you to the doctor, etc)   Has there ever been a time when you did not have enough food to eat?   Have you ever been homeless?   Have you ever seen or heard someone in your family/home being beaten up or get threatened with bodily harm?   Have you ever seen or heard someone being beaten, or seen someone who was badly hurt?   Have you ever seen someone who was dead or dying, or watched or heard them being killed?     Have you ever been threatened with a weapon?      Has anyone ever made you do (or tried to make you do) sexual things that you didn't want to do, like touch you, make you touch them, or try to have any kind of sex with you?   Has anyone ever forced you to have intercourse?   Is there anything else really scary or upsetting that has happened to you that I  haven't asked about?   PTSD REACTIONS/SYMPTOMS (mark with X if present)  Recurrent and intrusive distressing memories of event:  Flashbacks/Feels/acts as if the event were recurring:   Distressing dreams related to the event:  Intense psychological distress to reminders of event:   Avoidance of memories, thoughts, feelings about event:  Physiological reactions to reminders of event:   Avoidance of external reminders of event:  Inability to remember aspects of the event:   Negative beliefs about oneself, others, the world:  Persistent negative emotional state/self-blame:   Detachment/inability to feel positive emotions:  Alterations in arousal and reactivity:    SUBSTANCE ABUSE  Substance Age of 1st Use Amount/frequency Last Use    Not Completed                  Motivation for use:    Interest in reducing use and attaining abstinence:    Longest period of abstinence:    Withdrawal symptoms:    Problems usage caused:    Non-chemical addiction issues: (gambling, pornography, etc)    EDUCATIONAL/EMPLOYMENT HISTORY   Highest level attained:  Gifted/honors/AP?   Current grade:  Not Completed Underachieving/failing?   Current school:  Behavior problems?   Changed schools frequently?  Bullied?   Receives [facility] services?  Truancy problems?   History of suspensions (reasons, dates):   Interests in school:    Military status:    LEGAL/GOVERNMENTAL HISTORY   Current legal status:  Not Completed  Past arrests, charges, incarcerations, etc:   Current DSS/DHHS involvement:   Past DSS/DHHS involvement:     DEVELOPMENT (please list any issues or concerns)  Developmental milestones (crawling, walking, talking, etc): Not Completed  Developmental condition (delay, autism, etc):    Learning disabilities:     PSYCHOSOCIAL STRENGTHS AND STRESSORS   Religious/cultural preferences: Muslim   Identified support persons:  None- refused to acknowledge any   Strengths/abilities/talents:  N/A  Hobbies/leisure:  Refused to acknowledge any  Relationship problems/needs: Currently  isolated himself from his family  Financial problems/needs:  Currently got fired from work, due to missed work day because of a back Estate manager/land agent resources:  [employer]  Housing problems/needs:  Possibly may be homeless in the future due to previous unpaid rent.   RISK ASSESSMENT (mark with X if present)  Current danger to self Thoughts of suicide/death:  Self-harming behaviors:    Suicide attempt:  Has plan:    Comments/clarify:  None     Past danger to self Thoughts of suicide/death:  Self-harming behaviors:    Suicide attempt:  Family history of suicide:     None     Current danger to others Thoughts to harm others:  Plans to harm others:    Threats to harm others:  Attempt to harm others:    Comments/clarify: None     Past danger to others Thoughts to harm others:  Plans to harm others:    Threats to harm others:  Attempt to harm others:    Comments/clarify: None    RISK TO SELF Low to no risk: X Moderate risk:  Severe risk:   RISK TO OTHERS Low to no risk: X Moderate risk:  Severe risk:    MENTAL STATUS (mark with X if observed)  APPEARANCE/DRESS  Neat: X Good hygiene: X Age appropriate: X   Sloppy:  Fair hygiene:  Eccentric:    Relaxed:  Poor hygiene:       BEHAVIOR Attentive: X Passive:   Adequate eye contact: X   Guarded:  Defensive:  Minimal eye contact:    Cooperative: X Hostile/irritable:  No eye contact:     MOTOR Hyper:  Hypo:  Rapid:    Agitated: X Tics:  Tremors:    Lethargic:  Calm:       LANGUAGE Unremarkable: X Pressured:  Expressive intact:    Mute:  Slurred:  Receptive intact:     AFFECT/MOOD  Calm:  Anxious:  Inappropriate:    Depressed:  Flat: X Elevated:    Labile:  Agitated:  Hypervigilant:     THOUGHT FORM Unremarkable:  Illogical:  Indecisive:    Circumstantial: X Flight of ideas:  Loose associations:     Obsessive thinking:  Distractible:  Tangential:     THOUGHT CONTENT Unremarkable: X Suicidal:  Obsessions:    Homicidal:  Delusions:  Hallucinations:    Suspicious:  Grandiose:  Phobias:     ORIENTATION Fully oriented: X Not oriented to person:  Not oriented to place:    Not oriented to time:  Not oriented to situation:       ATTENTION/ CONCENTRATION Adequate: X Mildly distractible:  Moderately distractible:    Severely distractible:  Problems concentrating:       INTELLECT Suspected above average: X Suspected average:  Suspected below average:    Known disability:  Uncertain:       MEMORY Within normal limits: X Impaired:  Selective:     PERCEPTIONS Unremarkable:  Auditory hallucinations:  Visual hallucinations:    Dissociation:  Traumatic flashbacks:  Ideas of reference:     JUDGEMENT Poor:  Fair: X Good:     INSIGHT Poor: X Fair:  Good:     IMPULSE CONTROL Adequate:  Needs to be addressed:  Poor: X       CLINICAL IMPRESSION/INTERPRETIVE (risk of harm, recovery environment, functional status, diagnostic criteria met)   Kabir has met evidence for unspecified housing and economic problem and acculturation difficulties. Evidence shows that his frustration and anger around  language being a barrier for him has made him aggressive and angry. Jamesyn not understanding how the system works in this country and how receiving services and resources for his basic needs has made him feelings of sadness and some symptoms of depression. Commodore reported that since coming to [place], it has been a challenge adjusting to westernized culture. Tavien concerns around his housing and economic needs are a contributing factor to his irritability with his family and those around him. Albaro disclosed that racing thoughts, lack of sleep and his excessive amount of worrying is due to economic and housing stress. He does not meet all the criteria for depression based on a verbal risk assessment  portion.                                  DIAGNOSIS   DSM-5 Code ICD-10 Code Diagnosis   V62.4 Z60.3 Acculturation Difficulty  V60.9  Z59.9  Unspecified Housing or Economic Problem        Treatment recommendations and service needs: Counseling every week       SIGNATURE  Printed name of clinician: Demitrius Crass [place]  Date:   03//27/19  Signature and credentials of clinician:  Date:   Signature of supervisor:  Date:

## 2017-08-12 LAB — COMPREHENSIVE METABOLIC PANEL
A/G RATIO: 1.3 (ref 1.2–2.2)
ALT: 32 IU/L (ref 0–44)
AST: 20 IU/L (ref 0–40)
Albumin: 4.3 g/dL (ref 3.5–5.5)
Alkaline Phosphatase: 141 IU/L — ABNORMAL HIGH (ref 39–117)
BILIRUBIN TOTAL: 0.2 mg/dL (ref 0.0–1.2)
BUN/Creatinine Ratio: 8 — ABNORMAL LOW (ref 9–20)
BUN: 11 mg/dL (ref 6–24)
CALCIUM: 10.1 mg/dL (ref 8.7–10.2)
CHLORIDE: 104 mmol/L (ref 96–106)
CO2: 21 mmol/L (ref 20–29)
Creatinine, Ser: 1.42 mg/dL — ABNORMAL HIGH (ref 0.76–1.27)
GFR calc Af Amer: 68 mL/min/{1.73_m2} (ref >59)
GFR calc non Af Amer: 58 mL/min/{1.73_m2} — ABNORMAL LOW (ref >59)
Globulin, Total: 3.3 g/dL (ref 1.5–4.5)
Glucose: 100 mg/dL — ABNORMAL HIGH (ref 65–99)
POTASSIUM: 4.9 mmol/L (ref 3.5–5.2)
Sodium: 140 mmol/L (ref 134–144)
Total Protein: 7.6 g/dL (ref 6.0–8.5)

## 2017-08-12 LAB — URINALYSIS, COMPLETE
BILIRUBIN UA: NEGATIVE
Glucose, UA: NEGATIVE
KETONES UA: NEGATIVE
LEUKOCYTES UA: NEGATIVE
Nitrite, UA: NEGATIVE
PROTEIN UA: NEGATIVE
RBC UA: NEGATIVE
Specific Gravity, UA: 1.017 (ref 1.005–1.030)
UUROB: 0.2 mg/dL (ref 0.2–1.0)
pH, UA: 5 (ref 5.0–7.5)

## 2017-08-12 LAB — MICROSCOPIC EXAMINATION

## 2017-08-14 ENCOUNTER — Telehealth: Payer: Self-pay | Admitting: Internal Medicine

## 2017-08-14 NOTE — Telephone Encounter (Signed)
To Dr. Mulberry for further direction 

## 2017-08-14 NOTE — Telephone Encounter (Signed)
Patient needs letter from physician stating why he cannot work and his health condition. ° °Letter should be addressed to °[employer] ° °Sent to attention to: Wasif Qureshi, Case Manager at [employer]. ° ° °Patient would like letter emailed to the Case Manager. °[email or web address] ° °If any questions please call Noor at [phone]. °  Patient needs letter from physician stating why he cannot work and his health condition.  Letter should be addressed to [employer]  Sent to attention to: ZOXWR Virgina Organ, Case Manager at [employer].   Patient would like letter emailed to the Case Manager. [email or web address]  If any questions please call Noor at [phone].

## 2017-08-23 NOTE — Telephone Encounter (Signed)
I do not have the ability to make that determination at this time as discussed previously when call came in.

## 2017-09-04 NOTE — Telephone Encounter (Signed)
Left message for Craig Gillespie we are unable to determine if he can work or not

## 2017-09-06 ENCOUNTER — Ambulatory Visit: Payer: Self-pay | Admitting: Internal Medicine

## 2017-10-13 ENCOUNTER — Ambulatory Visit: Payer: Self-pay | Admitting: Internal Medicine
